# Patient Record
Sex: Female | Born: 1975
Health system: Southern US, Community
[De-identification: ages and names within clinical notes are randomized; demographics above are authoritative.]

## PROBLEM LIST (undated history)

## (undated) DIAGNOSIS — F32A Depression, unspecified: Secondary | ICD-10-CM

## (undated) DIAGNOSIS — K219 Gastro-esophageal reflux disease without esophagitis: Secondary | ICD-10-CM

## (undated) DIAGNOSIS — T7840XA Allergy, unspecified, initial encounter: Secondary | ICD-10-CM

## (undated) DIAGNOSIS — K589 Irritable bowel syndrome without diarrhea: Secondary | ICD-10-CM

## (undated) HISTORY — DX: Depression, unspecified: F32.A

## (undated) HISTORY — PX: AUGMENTATION MAMMAPLASTY: SUR837

## (undated) HISTORY — PX: OTHER SURGICAL HISTORY: SHX169

## (undated) HISTORY — DX: Gastro-esophageal reflux disease without esophagitis: K21.9

## (undated) HISTORY — PX: WISDOM TOOTH EXTRACTION: SHX21

## (undated) HISTORY — DX: Allergy, unspecified, initial encounter: T78.40XA

## (undated) HISTORY — DX: Irritable bowel syndrome, unspecified: K58.9

---

## 2017-08-20 ENCOUNTER — Other Ambulatory Visit: Payer: Self-pay | Admitting: Student

## 2017-08-20 DIAGNOSIS — B373 Candidiasis of vulva and vagina: Secondary | ICD-10-CM

## 2017-08-20 DIAGNOSIS — B3731 Acute candidiasis of vulva and vagina: Secondary | ICD-10-CM

## 2017-08-20 MED ORDER — FLUCONAZOLE 150 MG PO TABS: 150.0000 mg | ORAL_TABLET | Freq: Every day | ORAL | 1 refills | Status: DC

## 2017-08-20 NOTE — Progress Notes (Signed)
Pt called in requesting diflucan for yeast infection. Verified by name and DOB. NKDA. Diflucan sent to pharmacy.  Jorje Guild, NP

## 2017-09-16 ENCOUNTER — Other Ambulatory Visit: Payer: Self-pay | Admitting: Advanced Practice Midwife

## 2017-09-16 DIAGNOSIS — Z1231 Encounter for screening mammogram for malignant neoplasm of breast: Secondary | ICD-10-CM

## 2017-09-19 ENCOUNTER — Other Ambulatory Visit: Payer: Self-pay | Admitting: Advanced Practice Midwife

## 2017-09-19 ENCOUNTER — Ambulatory Visit: Payer: Self-pay | Admitting: Advanced Practice Midwife

## 2017-09-19 DIAGNOSIS — Z1231 Encounter for screening mammogram for malignant neoplasm of breast: Secondary | ICD-10-CM

## 2017-09-21 DIAGNOSIS — Z0000 Encounter for general adult medical examination without abnormal findings: Secondary | ICD-10-CM | POA: Diagnosis not present

## 2017-09-21 DIAGNOSIS — J309 Allergic rhinitis, unspecified: Secondary | ICD-10-CM | POA: Diagnosis not present

## 2017-09-21 DIAGNOSIS — Z23 Encounter for immunization: Secondary | ICD-10-CM | POA: Diagnosis not present

## 2017-09-23 ENCOUNTER — Ambulatory Visit (INDEPENDENT_AMBULATORY_CARE_PROVIDER_SITE_OTHER): Payer: 59

## 2017-09-23 DIAGNOSIS — Z1231 Encounter for screening mammogram for malignant neoplasm of breast: Secondary | ICD-10-CM | POA: Diagnosis not present

## 2017-10-11 ENCOUNTER — Ambulatory Visit: Payer: Self-pay

## 2017-10-28 ENCOUNTER — Ambulatory Visit: Payer: Self-pay | Admitting: Advanced Practice Midwife

## 2017-11-14 ENCOUNTER — Ambulatory Visit (INDEPENDENT_AMBULATORY_CARE_PROVIDER_SITE_OTHER): Payer: 59 | Admitting: Advanced Practice Midwife

## 2017-11-14 ENCOUNTER — Encounter: Payer: Self-pay | Admitting: Advanced Practice Midwife

## 2017-11-14 VITALS — BP 115/73 | HR 91 | Resp 16 | Ht 67.0 in | Wt 139.0 lb

## 2017-11-14 DIAGNOSIS — Z01419 Encounter for gynecological examination (general) (routine) without abnormal findings: Secondary | ICD-10-CM

## 2017-11-14 DIAGNOSIS — Z0000 Encounter for general adult medical examination without abnormal findings: Secondary | ICD-10-CM

## 2017-11-14 DIAGNOSIS — Z7184 Encounter for health counseling related to travel: Secondary | ICD-10-CM

## 2017-11-14 MED ORDER — SCOPOLAMINE 1 MG/3DAYS TD PT72: 1.0000 | MEDICATED_PATCH | TRANSDERMAL | 1 refills | Status: DC

## 2017-11-14 NOTE — Patient Instructions (Signed)

## 2017-11-14 NOTE — Progress Notes (Signed)
Subjective:     Pamela Williamson is a 42 y.o. female here for a routine well-woman exam.  Current complaints: none.  Pt has primary care provider and participates in routine preventive health care.   Gynecologic History Patient's last menstrual period was 11/08/2017. Contraception: none and husband with infertility Last Pap: 2018. Results were: normal Last mammogram: 2019. Results were: normal  Obstetric History OB History  Gravida Para Term Preterm AB Living  2 2 2         SAB TAB Ectopic Multiple Live Births               # Outcome Date GA Lbr Len/2nd Weight Sex Delivery Anes PTL Lv  2 Term 03/21/03    Jerilynn Mages Vag-Spont     1 Term 04/28/01     Vag-Spont        The following portions of the patient's history were reviewed and updated as appropriate: allergies, current medications, past family history, past medical history, past social history, past surgical history and problem list.  Review of Systems A comprehensive review of systems was negative.    Objective:   BP 115/73   Pulse 91   Resp 16   Ht 5\' 7"  (1.702 m)   Wt 139 lb (63 kg)   LMP 11/08/2017   BMI 21.77 kg/m    VS reviewed, nursing note reviewed,  Constitutional: well developed, well nourished, no distress HEENT: normocephalic CV: normal rate Pulm/chest wall: normal effort Breast Exam:  right breast normal without mass, skin or nipple changes or axillary nodes, left breast normal without mass, skin or nipple changes or axillary nodes Abdomen: soft Neuro: alert and oriented x 3 Skin: warm, dry Psych: affect normal Pelvic exam: Deferred  Assessment/Plan:   1. Well woman exam (no gynecological exam) --Pap in 2023 per ASCCP guidelines, well woman exam annually. --Considering IUD for period management. Will rescheduled when desired.  2. Counseling for travel --Pt going on cruise later this month wants to plan for possible motion sickness at sea. - scopolamine (TRANSDERM-SCOP, 1.5 MG,) 1 MG/3DAYS; Place 1  patch (1.5 mg total) onto the skin every 3 (three) days.  Dispense: 10 patch; Refill: 1

## 2018-03-08 ENCOUNTER — Other Ambulatory Visit: Payer: Self-pay | Admitting: Student

## 2018-03-08 DIAGNOSIS — J069 Acute upper respiratory infection, unspecified: Secondary | ICD-10-CM

## 2018-03-08 MED ORDER — AMOXICILLIN-POT CLAVULANATE 875-125 MG PO TABS: 1.0000 | ORAL_TABLET | Freq: Two times a day (BID) | ORAL | 0 refills | Status: AC

## 2018-03-08 MED ORDER — HYDROCOD POLST-CPM POLST ER 10-8 MG/5ML PO SUER: 5.0000 mL | Freq: Two times a day (BID) | ORAL | 0 refills | Status: AC | PRN

## 2018-03-08 NOTE — Progress Notes (Signed)
Patient called in requesting meds for URI & cough that have been ongoing for 2 weeks. Symptoms not improved with OTC meds.  Allergies & pharmacy reviewed.  Discussed reasons to seek immediate tx.   Jorje Guild, NP

## 2018-09-28 ENCOUNTER — Other Ambulatory Visit: Payer: Self-pay | Admitting: Advanced Practice Midwife

## 2018-09-28 DIAGNOSIS — Z1231 Encounter for screening mammogram for malignant neoplasm of breast: Secondary | ICD-10-CM

## 2018-10-05 ENCOUNTER — Ambulatory Visit (INDEPENDENT_AMBULATORY_CARE_PROVIDER_SITE_OTHER): Payer: 59

## 2018-10-05 ENCOUNTER — Other Ambulatory Visit: Payer: Self-pay

## 2018-10-05 DIAGNOSIS — Z1231 Encounter for screening mammogram for malignant neoplasm of breast: Secondary | ICD-10-CM | POA: Diagnosis not present

## 2018-11-07 ENCOUNTER — Other Ambulatory Visit: Payer: Self-pay

## 2018-11-07 ENCOUNTER — Encounter: Payer: Self-pay | Admitting: Advanced Practice Midwife

## 2018-11-07 ENCOUNTER — Ambulatory Visit (INDEPENDENT_AMBULATORY_CARE_PROVIDER_SITE_OTHER): Payer: 59 | Admitting: Advanced Practice Midwife

## 2018-11-07 VITALS — BP 128/78 | HR 75 | Resp 16 | Ht 67.0 in | Wt 140.0 lb

## 2018-11-07 DIAGNOSIS — Z30430 Encounter for insertion of intrauterine contraceptive device: Secondary | ICD-10-CM

## 2018-11-07 DIAGNOSIS — Z975 Presence of (intrauterine) contraceptive device: Secondary | ICD-10-CM | POA: Insufficient documentation

## 2018-11-07 DIAGNOSIS — N939 Abnormal uterine and vaginal bleeding, unspecified: Secondary | ICD-10-CM

## 2018-11-07 MED ORDER — LEVONORGESTREL 19.5 MCG/DAY IU IUD
INTRAUTERINE_SYSTEM | Freq: Once | INTRAUTERINE | Status: AC
  Administered 2018-11-07: 15:00:00 via INTRAUTERINE

## 2018-11-07 NOTE — Progress Notes (Signed)
  GYNECOLOGY PROGRESS NOTE  History:  43 y.o. G2P2000 presents to [facility] office today for problem gyn visit. She reports regular heavy menses, becoming heavier in the last year.  Has tried low dose OCPs with no improvement.  Has cramping pain treated with ibuprofen during menses, with no pain in between menses. Cycles are every 28 days, soaking through super tampons every 2 hours and bleeding on to her clothes at times. She has spotting x 1 week before menses begin so long cycle of bleeding each month is bothersome for her.  She denies h/a, dizziness, shortness of breath, n/v, or fever/chills.    The following portions of the patient's history were reviewed and updated as appropriate: allergies, current medications, past family history, past medical history, past social history, past surgical history and problem list. Last pap smear 2018 was normal, Negative HRHPV.  Review of Systems:  Pertinent items are noted in HPI.   Objective:  Physical Exam Blood pressure 128/78, pulse 75, resp. rate 16, height 5\' 7"  (1.702 m), weight 140 lb (63.5 kg), last menstrual period 11/03/2018. VS reviewed, nursing note reviewed,  Constitutional: well developed, well nourished, no distress HEENT: normocephalic CV: normal rate Pulm/chest wall: normal effort Breast Exam: deferred Abdomen: soft Neuro: alert and oriented x 3 Skin: warm, dry Psych: affect normal Pelvic exam: Cervix pink, visually closed, without lesion, small amount dark red bleeding, vaginal walls and external genitalia normal   IUD Procedure Note Patient identified, informed consent performed.  Discussed risks of irregular bleeding, cramping, infection, malpositioning or misplacement of the IUD outside the uterus which may require further procedures. Time out was performed.  Urine pregnancy test negative.  Speculum placed in the vagina.  Cervix visualized.  Cleaned with Betadine x 2.  Grasped anteriorly with a single tooth tenaculum.  Uterus  sounded to 6.5 cm.  Liletta IUD placed per manufacturer's recommendations.  Strings trimmed to 4 cm. Tenaculum was removed, good hemostasis noted.  Patient tolerated procedure well.   Patient was given post-procedure instructions and the Liletta care card with expiration date.  Patient was also asked to check IUD strings periodically and follow up in 4-6 weeks for IUD check.   Assessment & Plan:   1. Encounter for IUD insertion --Discussed benefits/risks of IUD --Liletta IUD placed without difficulty. See procedure note. --String check in 4-6 weeks  2. Abnormal uterine bleeding (AUB) --Pt with regular heavy menses, becoming heavier in last year.  Has tried low dose OCPs with no improvement.  Has cramping pain treated with ibuprofen during menses, no pain in between menses. Cycles are every 28 days, soaking through super tampons every 2 hours and bleeding on to her clothes at times. --She had IUD before she had children, desires to try IUD for bleeding management.  Fatima Blank, CNM 4:12 PM

## 2018-11-30 DIAGNOSIS — J309 Allergic rhinitis, unspecified: Secondary | ICD-10-CM | POA: Diagnosis not present

## 2018-11-30 DIAGNOSIS — Z0000 Encounter for general adult medical examination without abnormal findings: Secondary | ICD-10-CM | POA: Diagnosis not present

## 2018-11-30 DIAGNOSIS — E785 Hyperlipidemia, unspecified: Secondary | ICD-10-CM | POA: Diagnosis not present

## 2018-12-06 DIAGNOSIS — Z0000 Encounter for general adult medical examination without abnormal findings: Secondary | ICD-10-CM | POA: Diagnosis not present

## 2018-12-06 DIAGNOSIS — E785 Hyperlipidemia, unspecified: Secondary | ICD-10-CM | POA: Diagnosis not present

## 2018-12-26 ENCOUNTER — Ambulatory Visit (INDEPENDENT_AMBULATORY_CARE_PROVIDER_SITE_OTHER): Payer: 59 | Admitting: Advanced Practice Midwife

## 2018-12-26 ENCOUNTER — Other Ambulatory Visit: Payer: Self-pay

## 2018-12-26 ENCOUNTER — Encounter: Payer: Self-pay | Admitting: Advanced Practice Midwife

## 2018-12-26 VITALS — BP 111/74 | HR 93 | Resp 16 | Ht 67.0 in | Wt 143.0 lb

## 2018-12-26 DIAGNOSIS — Z1151 Encounter for screening for human papillomavirus (HPV): Secondary | ICD-10-CM | POA: Diagnosis not present

## 2018-12-26 DIAGNOSIS — N939 Abnormal uterine and vaginal bleeding, unspecified: Secondary | ICD-10-CM

## 2018-12-26 DIAGNOSIS — Z30431 Encounter for routine checking of intrauterine contraceptive device: Secondary | ICD-10-CM

## 2018-12-26 DIAGNOSIS — Z01419 Encounter for gynecological examination (general) (routine) without abnormal findings: Secondary | ICD-10-CM | POA: Diagnosis not present

## 2018-12-26 DIAGNOSIS — Z124 Encounter for screening for malignant neoplasm of cervix: Secondary | ICD-10-CM

## 2018-12-26 NOTE — Progress Notes (Signed)
  GYNECOLOGY CLINIC PROGRESS NOTE  History:  43 y.o. G2P2000 here today for today for IUD string check; Liletta IUD was placed  11/07/18. No complaints about the IUD, no concerning side effects. Pt continues to have AUB with frequent spotting, plus light monthly menses, but wants to continue IUD at this time.  No episodes of heavy bleeding since IUD was placed.  Pt has primary care provider, last visit in 2020, with routine health maintenance up to date including lipids.  The following portions of the patient's history were reviewed and updated as appropriate: allergies, current medications, past family history, past medical history, past social history, past surgical history and problem list. Last pap smear on 2018 was normal, neg HRHPV.  Review of Systems:  Pertinent items are noted in HPI.   Objective:  Physical Exam Blood pressure 111/74, pulse 93, resp. rate 16, height 5\' 7"  (1.702 m), weight 143 lb (64.9 kg). Gen: NAD Abd: Soft, nontender and nondistended Pelvic: Normal appearing external genitalia; normal appearing vaginal mucosa and cervix.  IUD strings visualized, about 3 cm in length outside cervix.   Assessment & Plan:  1. Well woman exam with routine gynecological exam --Doing well, no gyn concerns other than AUB addressed at last visit. IUD in place, string check today. --Mammogram this year wnl - Cytology - PAP( [place])  2. IUD check up --String visible, wnl  3. Abnormal uterine bleeding (AUB) --Pt with 18 days of light bleeding this month, light period followed by mostly spotting for 2 weeks, now resolved.  No episodes of heavy bleeding.  Will continue with IUD and schedule follow up as needed.  Patient to keep IUD in place for five years; can come in for removal if she desires pregnancy within the next five years. Routine preventative health maintenance measures emphasized.  Fatima Blank, CNM 9:45 AM

## 2018-12-27 LAB — CYTOLOGY - PAP
Diagnosis: NEGATIVE
HPV: NOT DETECTED

## 2019-10-09 ENCOUNTER — Other Ambulatory Visit: Payer: Self-pay | Admitting: Advanced Practice Midwife

## 2019-10-09 DIAGNOSIS — Z1231 Encounter for screening mammogram for malignant neoplasm of breast: Secondary | ICD-10-CM

## 2019-10-11 ENCOUNTER — Ambulatory Visit (INDEPENDENT_AMBULATORY_CARE_PROVIDER_SITE_OTHER): Payer: 59

## 2019-10-11 ENCOUNTER — Other Ambulatory Visit: Payer: Self-pay

## 2019-10-11 DIAGNOSIS — Z1231 Encounter for screening mammogram for malignant neoplasm of breast: Secondary | ICD-10-CM | POA: Diagnosis not present

## 2020-02-25 DIAGNOSIS — Z20828 Contact with and (suspected) exposure to other viral communicable diseases: Secondary | ICD-10-CM | POA: Diagnosis not present

## 2020-06-05 DIAGNOSIS — Z20828 Contact with and (suspected) exposure to other viral communicable diseases: Secondary | ICD-10-CM | POA: Diagnosis not present

## 2020-08-25 ENCOUNTER — Other Ambulatory Visit: Payer: Self-pay | Admitting: Student

## 2020-08-25 MED ORDER — CYCLOBENZAPRINE HCL 5 MG PO TABS: 5.0000 mg | ORAL_TABLET | Freq: Three times a day (TID) | ORAL | 0 refills | Status: AC | PRN

## 2020-10-07 ENCOUNTER — Other Ambulatory Visit (HOSPITAL_BASED_OUTPATIENT_CLINIC_OR_DEPARTMENT_OTHER): Payer: Self-pay | Admitting: Advanced Practice Midwife

## 2020-10-07 DIAGNOSIS — Z1231 Encounter for screening mammogram for malignant neoplasm of breast: Secondary | ICD-10-CM

## 2020-10-15 ENCOUNTER — Ambulatory Visit (INDEPENDENT_AMBULATORY_CARE_PROVIDER_SITE_OTHER): Payer: 59

## 2020-10-15 ENCOUNTER — Other Ambulatory Visit: Payer: Self-pay

## 2020-10-15 DIAGNOSIS — Z1231 Encounter for screening mammogram for malignant neoplasm of breast: Secondary | ICD-10-CM | POA: Diagnosis not present

## 2020-11-04 DIAGNOSIS — Z0000 Encounter for general adult medical examination without abnormal findings: Secondary | ICD-10-CM | POA: Diagnosis not present

## 2020-11-04 DIAGNOSIS — J309 Allergic rhinitis, unspecified: Secondary | ICD-10-CM | POA: Diagnosis not present

## 2020-11-11 DIAGNOSIS — Z0000 Encounter for general adult medical examination without abnormal findings: Secondary | ICD-10-CM | POA: Diagnosis not present

## 2021-01-05 DIAGNOSIS — L579 Skin changes due to chronic exposure to nonionizing radiation, unspecified: Secondary | ICD-10-CM | POA: Diagnosis not present

## 2021-01-05 DIAGNOSIS — L821 Other seborrheic keratosis: Secondary | ICD-10-CM | POA: Diagnosis not present

## 2021-01-05 DIAGNOSIS — D1801 Hemangioma of skin and subcutaneous tissue: Secondary | ICD-10-CM | POA: Diagnosis not present

## 2021-01-05 DIAGNOSIS — L814 Other melanin hyperpigmentation: Secondary | ICD-10-CM | POA: Diagnosis not present

## 2021-01-05 DIAGNOSIS — D225 Melanocytic nevi of trunk: Secondary | ICD-10-CM | POA: Diagnosis not present

## 2021-01-21 ENCOUNTER — Encounter: Payer: Self-pay | Admitting: Internal Medicine

## 2021-02-18 ENCOUNTER — Ambulatory Visit (AMBULATORY_SURGERY_CENTER): Payer: Self-pay

## 2021-02-18 ENCOUNTER — Other Ambulatory Visit: Payer: Self-pay

## 2021-02-18 ENCOUNTER — Encounter: Payer: Self-pay | Admitting: Internal Medicine

## 2021-02-18 VITALS — Ht 67.0 in | Wt 140.0 lb

## 2021-02-18 DIAGNOSIS — Z1211 Encounter for screening for malignant neoplasm of colon: Secondary | ICD-10-CM

## 2021-02-18 MED ORDER — NA SULFATE-K SULFATE-MG SULF 17.5-3.13-1.6 GM/177ML PO SOLN: 1.0000 | ORAL | 0 refills | Status: DC

## 2021-02-18 NOTE — Progress Notes (Signed)

## 2021-03-03 ENCOUNTER — Ambulatory Visit (AMBULATORY_SURGERY_CENTER): Payer: 59 | Admitting: Internal Medicine

## 2021-03-03 ENCOUNTER — Other Ambulatory Visit: Payer: Self-pay

## 2021-03-03 ENCOUNTER — Encounter: Payer: Self-pay | Admitting: Internal Medicine

## 2021-03-03 VITALS — BP 110/76 | HR 72 | Temp 97.5°F | Resp 20 | Ht 67.0 in | Wt 140.0 lb

## 2021-03-03 DIAGNOSIS — Z1211 Encounter for screening for malignant neoplasm of colon: Secondary | ICD-10-CM

## 2021-03-03 DIAGNOSIS — K649 Unspecified hemorrhoids: Secondary | ICD-10-CM

## 2021-03-03 DIAGNOSIS — D124 Benign neoplasm of descending colon: Secondary | ICD-10-CM

## 2021-03-03 DIAGNOSIS — D123 Benign neoplasm of transverse colon: Secondary | ICD-10-CM | POA: Diagnosis not present

## 2021-03-03 DIAGNOSIS — K219 Gastro-esophageal reflux disease without esophagitis: Secondary | ICD-10-CM | POA: Diagnosis not present

## 2021-03-03 DIAGNOSIS — K635 Polyp of colon: Secondary | ICD-10-CM | POA: Diagnosis not present

## 2021-03-03 HISTORY — PX: COLONOSCOPY: SHX174

## 2021-03-03 MED ORDER — HYDROCORTISONE ACE-PRAMOXINE 1-1 % EX CREA: 1.0000 "application " | TOPICAL_CREAM | Freq: Two times a day (BID) | CUTANEOUS | 0 refills | Status: AC

## 2021-03-03 MED ORDER — SODIUM CHLORIDE 0.9 % IV SOLN: 500.0000 mL | Freq: Once | INTRAVENOUS | Status: DC

## 2021-03-03 NOTE — Patient Instructions (Signed)
Discharge instructions given. Handouts on polyps and hemorrhoids. Resume previous medications. YOU HAD AN ENDOSCOPIC PROCEDURE TODAY AT THE [facility]:   Refer to the procedure report that was given to you for any specific questions about what was found during the examination.  If the procedure report does not answer your questions, please call your gastroenterologist to clarify.  If you requested that your care partner not be given the details of your procedure findings, then the procedure report has been included in a sealed envelope for you to review at your convenience later.  YOU SHOULD EXPECT: Some feelings of bloating in the abdomen. Passage of more gas than usual.  Walking can help get rid of the air that was put into your GI tract during the procedure and reduce the bloating. If you had a lower endoscopy (such as a colonoscopy or flexible sigmoidoscopy) you may notice spotting of blood in your stool or on the toilet paper. If you underwent a bowel prep for your procedure, you may not have a normal bowel movement for a few days.  Please Note:  You might notice some irritation and congestion in your nose or some drainage.  This is from the oxygen used during your procedure.  There is no need for concern and it should clear up in a day or so.  SYMPTOMS TO REPORT IMMEDIATELY:  Following lower endoscopy (colonoscopy or flexible sigmoidoscopy):  Excessive amounts of blood in the stool  Significant tenderness or worsening of abdominal pains  Swelling of the abdomen that is new, acute  Fever of 100F or higher   For urgent or emergent issues, a gastroenterologist can be reached at any hour by calling [phone]. Do not use MyChart messaging for urgent concerns.    DIET:  We do recommend a small meal at first, but then you may proceed to your regular diet.  Drink plenty of fluids but you should avoid alcoholic beverages for 24 hours.  ACTIVITY:  You should plan to take it  easy for the rest of today and you should NOT DRIVE or use heavy machinery until tomorrow (because of the sedation medicines used during the test).    FOLLOW UP: Our staff will call the number listed on your records 48-72 hours following your procedure to check on you and address any questions or concerns that you may have regarding the information given to you following your procedure. If we do not reach you, we will leave a message.  We will attempt to reach you two times.  During this call, we will ask if you have developed any symptoms of COVID 19. If you develop any symptoms (ie: fever, flu-like symptoms, shortness of breath, cough etc.) before then, please call [phone].  If you test positive for Covid 19 in the 2 weeks post procedure, please call and report this information to us.    If any biopsies were taken you will be contacted by phone or by letter within the next 1-3 weeks.  Please call us at [phone] if you have not heard about the biopsies in 3 weeks.    SIGNATURES/CONFIDENTIALITY: You and/or your care partner have signed paperwork which will be entered into your electronic medical record.  These signatures attest to the fact that that the information above on your After Visit Summary has been reviewed and is understood.  Full responsibility of the confidentiality of this discharge information lies with you and/or your care-partner.  

## 2021-03-03 NOTE — Progress Notes (Signed)
Report given to PACU, vss 

## 2021-03-03 NOTE — Progress Notes (Signed)
GASTROENTEROLOGY PROCEDURE H&P NOTE   Primary Care Physician: Cyril Loosen, NP    Reason for Procedure:   Colon cancer screening  Plan:    Colonoscopy  Patient is appropriate for endoscopic procedure(s) in the ambulatory ([place]) setting.  The nature of the procedure, as well as the risks, benefits, and alternatives were carefully and thoroughly reviewed with the patient. Ample time for discussion and questions allowed. The patient understood, was satisfied, and agreed to proceed.     HPI: Pamela Williamson is a 45 y.o. female who presents for colonoscopy for colon cancer screening. Sees scant amounts of rectal bleeding when she wipes with toilet paper. Denies melena, unintentional weight loss, and changes in bowel habits. Denies blood thinner use. Denies fam hx of colon cancer. She denies prior abdominal surgeries.  Past Medical History:  Diagnosis Date   Allergy    Depression    GERD (gastroesophageal reflux disease)    IBS (irritable bowel syndrome)     Past Surgical History:  Procedure Laterality Date   AUGMENTATION MAMMAPLASTY Bilateral    Breast augmentation with breast lift   index finger  Right    WISDOM TOOTH EXTRACTION     x4 removed    Prior to Admission medications   Medication Sig Start Date End Date Taking? Authorizing Provider  betamethasone, augmented, (DIPROLENE) 0.05 % gel  11/20/18  Yes [provider]  Levonorgestrel (LILETTA, 52 MG, IU) by Intrauterine route.   Yes [provider]  cetirizine (ZYRTEC) 10 MG tablet Take 10 mg by mouth daily as needed for allergies.    [provider]  cyclobenzaprine (FLEXERIL) 5 MG tablet Take 1 tablet (5 mg total) by mouth 3 (three) times daily as needed for muscle spasms (& back pain). 08/25/20   Jorje Guild, NP  diphenhydrAMINE (BENADRYL) 12.5 MG chewable tablet Chew 12.5 mg by mouth 4 (four) times daily as needed for allergies.    [provider]  ibuprofen (ADVIL) 200  MG tablet Take by mouth.    [provider]    Current Outpatient Medications  Medication Sig Dispense Refill   betamethasone, augmented, (DIPROLENE) 0.05 % gel      Levonorgestrel (LILETTA, 52 MG, IU) by Intrauterine route.     cetirizine (ZYRTEC) 10 MG tablet Take 10 mg by mouth daily as needed for allergies.     cyclobenzaprine (FLEXERIL) 5 MG tablet Take 1 tablet (5 mg total) by mouth 3 (three) times daily as needed for muscle spasms (& back pain). 20 tablet 0   diphenhydrAMINE (BENADRYL) 12.5 MG chewable tablet Chew 12.5 mg by mouth 4 (four) times daily as needed for allergies.     ibuprofen (ADVIL) 200 MG tablet Take by mouth.     Current Facility-Administered Medications  Medication Dose Route Frequency Provider Last Rate Last Admin   0.9 %  sodium chloride infusion  500 mL Intravenous Once Sharyn Creamer, MD        Allergies as of 03/03/2021   (No Known Allergies)    Family History  Problem Relation Age of Onset   Hypertension Mother    Hyperlipidemia Mother    Diabetes Mother    Obesity Mother    Colon polyps Father    Parkinson's disease Father    Hypothyroidism Sister    Arthritis/Rheumatoid Sister    Hypertension Sister    Breast cancer Maternal Grandmother    Colon cancer Neg Hx    Esophageal cancer Neg Hx    Rectal  cancer Neg Hx    Stomach cancer Neg Hx     Social History   Socioeconomic History   Marital status: Married    Spouse name: Not on file   Number of children: Not on file   Years of education: Not on file   Highest education level: Not on file  Occupational History   Occupation: midwife  Tobacco Use   Smoking status: Never   Smokeless tobacco: Never  Vaping Use   Vaping Use: Never used  Substance and Sexual Activity   Alcohol use: Yes    Comment: rarely   Drug use: Never   Sexual activity: Yes    Partners: Male    Birth control/protection: I.U.D.    Comment: Husband has absence of Vas Deferens  Other Topics Concern   Not  on file  Social History Narrative   Not on file   Social Determinants of Health   Financial Resource Strain: Not on file  Food Insecurity: Not on file  Transportation Needs: Not on file  Physical Activity: Not on file  Stress: Not on file  Social Connections: Not on file  Intimate Partner Violence: Not on file    Physical Exam: Vital signs in last 24 hours: BP 130/87   Pulse 90   Temp (!) 97.5 F (36.4 C) (Temporal)   Ht 5\' 7"  (1.702 m)   Wt 140 lb (63.5 kg)   SpO2 100%   BMI 21.93 kg/m  GEN: NAD EYE: Sclerae anicteric ENT: MMM CV: Non-tachycardic Pulm: No increased work of breathing GI: Soft, NT/ND NEURO:  Alert & Oriented   Christia Reading, MD [facility]  03/03/2021 1:33 PM

## 2021-03-03 NOTE — Progress Notes (Signed)
Called to room to assist during endoscopic procedure.  Patient ID and intended procedure confirmed with present staff. Received instructions for my participation in the procedure from the performing physician.  

## 2021-03-03 NOTE — Progress Notes (Signed)
Pt's states no medical or surgical changes since previsit or office visit. 

## 2021-03-03 NOTE — Op Note (Signed)
[place] Patient Name: Pamela Williamson Procedure Date: 03/03/2021 1:40 PM MRN: [id number] Endoscopist: Sonny Masters "Pamela Williamson ,  Age: 45 Referring MD:  Date of Birth: 1976/01/23 Gender: Female Account #: [id number] Procedure:                Colonoscopy Indications:              Screening for colorectal malignant neoplasm Medicines:                Monitored Anesthesia Care Procedure:                Pre-Anesthesia Assessment:                           - Prior to the procedure, a History and Physical                            was performed, and patient medications and                            allergies were reviewed. The patient's tolerance of                            previous anesthesia was also reviewed. The risks                            and benefits of the procedure and the sedation                            options and risks were discussed with the patient.                            All questions were answered, and informed consent                            was obtained. Prior Anticoagulants: The patient has                            taken no previous anticoagulant or antiplatelet                            agents. ASA Grade Assessment: II - A patient with                            mild systemic disease. After reviewing the risks                            and benefits, the patient was deemed in                            satisfactory condition to undergo the procedure.                           After obtaining informed consent, the colonoscope  was passed under direct vision. Throughout the                            procedure, the patient's blood pressure, pulse, and                            oxygen saturations were monitored continuously. The                            PCF-HQ190L Colonoscope was introduced through the                            anus and advanced to the the terminal ileum. The                             colonoscopy was performed without difficulty. The                            patient tolerated the procedure well. The quality                            of the bowel preparation was good. Scope In: 1:45:31 PM Scope Out: 2:18:28 PM Scope Withdrawal Time: 0 hours 26 minutes 45 seconds  Total Procedure Duration: 0 hours 32 minutes 57 seconds  Findings:                 The terminal ileum appeared normal.                           Four sessile polyps were found in the descending                            colon and transverse colon. The polyps were 2 to 10                            mm in size. These polyps were removed with a cold                            snare. Resection and retrieval were complete.                           Non-bleeding external and internal hemorrhoids were                            found during retroflexion and during perianal exam. Complications:            No immediate complications. Estimated Blood Loss:     Estimated blood loss was minimal. Impression:               - The examined portion of the ileum was normal.                           - Four 2 to 10 mm polyps in the descending colon  and in the transverse colon, removed with a cold                            snare. Resected and retrieved.                           - Non-bleeding external and internal hemorrhoids. Recommendation:           - Discharge patient to home (with escort).                           - Await pathology results.                           - The findings and recommendations were discussed                            with the patient. [id number]Pamela Williamson,  03/03/2021 2:24:37 PM

## 2021-03-05 ENCOUNTER — Telehealth: Payer: Self-pay | Admitting: *Deleted

## 2021-03-05 ENCOUNTER — Telehealth: Payer: Self-pay

## 2021-03-05 NOTE — Telephone Encounter (Signed)
  Follow up Call-  Call back number 03/03/2021  Post procedure Call Back phone  # [phone]  Permission to leave phone message Yes  Some recent data might be hidden     Patient questions:  Do you have a fever, pain , or abdominal swelling? No. Pain Score  0 *  Have you tolerated food without any problems? Yes.    Have you been able to return to your normal activities? Yes.    Do you have any questions about your discharge instructions: Diet   No. Medications  No. Follow up visit  No.  Do you have questions or concerns about your Care? No.  Actions: * If pain score is 4 or above: No action needed, pain <4.   Have you developed a fever since your procedure? no  2.   Have you had an respiratory symptoms (SOB or cough) since your procedure? no  3.   Have you tested positive for COVID 19 since your procedure no  4.   Have you had any family members/close contacts diagnosed with the COVID 19 since your procedure?  no   If yes to any of these questions please route to Joylene John, RN and Joella Prince, RN

## 2021-03-05 NOTE — Telephone Encounter (Signed)
Attempted f/u phone call. No answer. Left message. °

## 2021-03-08 ENCOUNTER — Encounter: Payer: Self-pay | Admitting: Internal Medicine

## 2021-03-27 ENCOUNTER — Ambulatory Visit: Payer: 59 | Admitting: Internal Medicine

## 2021-04-08 NOTE — Progress Notes (Signed)
Chief Complaint: IBS  HPI : 45 year old female with history of GERD and IBS presents for IBS  Over the last few years, she has noticed that she has had bloating, almost like she has trapped gas. This causes some abdominal pain. The pain is located diffusely, both in the upper and lower abdomen. It has been difficult to specifically pinpoint a food that has been making her symptoms worse. A lot of times when she has a BM, she has mushy stools. On average she has one stool every other day. After her colonoscopy, her stools were more normal in appearance.  After colonoscopy, she also felt like her abdominal pain and bloating was improved.  If she has a big BM, then her symptoms improve for all. She does not think that she overtly constipated. If she eats pasta that has a buttery cream sauce, sometimes this will cause her symptoms.  However, the next time she has the same food, she may not experience the same symptoms.  Denies N&V, hematochezia, melena, weight loss, rectal pain.  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  She has had 2 children in the past via vaginal delivery.  Past Medical History:  Diagnosis Date   Allergy    Depression    GERD (gastroesophageal reflux disease)    IBS (irritable bowel syndrome)      Past Surgical History:  Procedure Laterality Date   AUGMENTATION MAMMAPLASTY Bilateral    Breast augmentation with breast lift   COLONOSCOPY  03/03/2021   Callee Rohrig at [facility]   index finger  Right    WISDOM TOOTH EXTRACTION     x4 removed   Family History  Problem Relation Age of Onset   Hypertension Mother    Hyperlipidemia Mother    Diabetes Mother    Obesity Mother    Colon polyps Father    Parkinson's disease Father    Hypothyroidism Sister    Arthritis/Rheumatoid Sister    Hypertension Sister    Breast cancer Maternal Grandmother    Colon cancer Neg Hx    Esophageal cancer Neg Hx    Rectal cancer Neg Hx    Stomach cancer Neg Hx    Social  History   Tobacco Use   Smoking status: Never   Smokeless tobacco: Never  Vaping Use   Vaping Use: Never used  Substance Use Topics   Alcohol use: Yes    Comment: rarely   Drug use: Never   Current Outpatient Medications  Medication Sig Dispense Refill   betamethasone, augmented, (DIPROLENE) 0.05 % gel      cetirizine (ZYRTEC) 10 MG tablet Take 10 mg by mouth daily as needed for allergies.     cyclobenzaprine (FLEXERIL) 5 MG tablet Take 1 tablet (5 mg total) by mouth 3 (three) times daily as needed for muscle spasms (& back pain). 20 tablet 0   diphenhydrAMINE (BENADRYL) 12.5 MG chewable tablet Chew 12.5 mg by mouth 4 (four) times daily as needed for allergies.     ibuprofen (ADVIL) 200 MG tablet Take by mouth.     Levonorgestrel (LILETTA, 52 MG, IU) by Intrauterine route.     pramoxine-hydrocortisone (PROCTOCREAM-HC) 1-1 % rectal cream Place 1 application rectally 2 (two) times daily. Apply twice a day for 7 days. 30 g 0   No current facility-administered medications for this visit.   No Known Allergies   Review of Systems: All systems reviewed and negative except where noted in HPI.  Physical Exam: There were no vitals taken for this visit. Constitutional: Pleasant,well-developed, female in no acute distress. HEENT: Normocephalic and atraumatic. Conjunctivae are normal. No scleral icterus. Cardiovascular: Normal rate, regular rhythm.  Pulmonary/chest: Effort normal and breath sounds normal. No wheezing, rales or rhonchi. Abdominal: Soft, nondistended, nontender. Bowel sounds active throughout. There are no masses palpable. No hepatomegaly. Extremities: No edema Neurological: Alert and oriented to person place and time. Skin: Skin is warm and dry. No rashes noted. Psychiatric: Normal mood and affect. Behavior is normal.  Labs 11/2020: CMP and CBC unremarkable.  Colonoscopy 03/03/21: - The examined portion of the ileum was normal. - Four 2 to 10 mm polyps in the  descending colon and in the transverse colon, removed with a cold snare. Resected and retrieved. - Non-bleeding external and internal hemorrhoids. Path: Surgical [P], colon, transverse and descending, polyp (3) - SESSILE SERRATED POLYP WITHOUT CYTOLOGIC DYSPLASIA. - HYPERPLASTIC POLYP(S).  ASSESSMENT AND PLAN:  History of sessile serrated adenoma IBS Constipation GERD Hemorrhoids Patient presents to discuss her issues with IBS and her recent finding of colon polyps.  I do agree that her symptoms of abdominal pain and bloating with some mild constipation do appear to be consistent with IBS.  She had improvement in her symptoms after her colonoscopy prep and has improvement in her symptoms after having a BM.  Thus I asked the patient to start off with conservative measures for encouraging more frequent bowel movements.  Will increase hydration, increase physical activity, start daily fiber supplement, and institute a low FODMAP diet.  I discussed with the finding of a sessile serrated adenoma means, and why we recommend follow-up colonoscopy within a certain time interval. - Encourage 8 cups of water per day - Walk 30 minutes every day - Eat daily fiber supplement with Metamucil or Benefiber - Low FODMAP diet - Repeat colonoscopy in 3 years for polyp surveillance - Patient will try the  hydrocortisone cream that I prescribed her previously for hemorrhoids - Consider Miralax or pelvic PT in the future - RTC PRN per patient preference  Christia Reading, MD

## 2021-04-09 ENCOUNTER — Ambulatory Visit (INDEPENDENT_AMBULATORY_CARE_PROVIDER_SITE_OTHER): Payer: 59 | Admitting: Internal Medicine

## 2021-04-09 ENCOUNTER — Encounter: Payer: Self-pay | Admitting: Internal Medicine

## 2021-04-09 VITALS — BP 122/72 | HR 80 | Ht 67.0 in | Wt 145.1 lb

## 2021-04-09 DIAGNOSIS — K581 Irritable bowel syndrome with constipation: Secondary | ICD-10-CM

## 2021-04-09 DIAGNOSIS — K649 Unspecified hemorrhoids: Secondary | ICD-10-CM

## 2021-04-09 DIAGNOSIS — Z86010 Personal history of colonic polyps: Secondary | ICD-10-CM

## 2021-04-09 DIAGNOSIS — K219 Gastro-esophageal reflux disease without esophagitis: Secondary | ICD-10-CM | POA: Diagnosis not present

## 2021-04-09 NOTE — Patient Instructions (Signed)
If you are age 47 or older, your body mass index should be between 23-30. Your Body mass index is 22.73 kg/m. If this is out of the aforementioned range listed, please consider follow up with your Primary Care Provider.  If you are age 40 or younger, your body mass index should be between 19-25. Your Body mass index is 22.73 kg/m. If this is out of the aformentioned range listed, please consider follow up with your Primary Care Provider.   ________________________________________________________  The [facility] providers would like to encourage you to use [facility] to communicate with providers for non-urgent requests or questions.  Due to long hold times on the telephone, sending your provider a message by [facility] may be a faster and more efficient way to get a response.  Please allow 48 business hours for a response.  Please remember that this is for non-urgent requests.  _______________________________________________________  Drink 8 cups per day  30 minutes of walking per day  Low FODMAP diet   Daily fiber supplement

## 2021-04-10 ENCOUNTER — Ambulatory Visit: Payer: 59 | Admitting: Internal Medicine

## 2021-07-16 DIAGNOSIS — M79642 Pain in left hand: Secondary | ICD-10-CM | POA: Diagnosis not present

## 2021-07-16 DIAGNOSIS — S62615A Displaced fracture of proximal phalanx of left ring finger, initial encounter for closed fracture: Secondary | ICD-10-CM | POA: Diagnosis not present

## 2021-07-16 DIAGNOSIS — S62627A Displaced fracture of medial phalanx of left little finger, initial encounter for closed fracture: Secondary | ICD-10-CM | POA: Diagnosis not present

## 2021-07-23 ENCOUNTER — Ambulatory Visit: Payer: 59 | Admitting: Orthopedic Surgery

## 2021-07-23 ENCOUNTER — Encounter: Payer: Self-pay | Admitting: Orthopedic Surgery

## 2021-07-23 ENCOUNTER — Other Ambulatory Visit: Payer: Self-pay

## 2021-07-23 ENCOUNTER — Ambulatory Visit: Payer: Self-pay

## 2021-07-23 VITALS — BP 123/86 | HR 97 | Ht 67.0 in | Wt 140.0 lb

## 2021-07-23 DIAGNOSIS — S62627A Displaced fracture of medial phalanx of left little finger, initial encounter for closed fracture: Secondary | ICD-10-CM

## 2021-07-23 DIAGNOSIS — S62645A Nondisplaced fracture of proximal phalanx of left ring finger, initial encounter for closed fracture: Secondary | ICD-10-CM | POA: Diagnosis not present

## 2021-07-23 NOTE — Progress Notes (Signed)
? ?Office Visit Note ?  ?Patient: Pamela Williamson           ?Date of Birth: 30-Sep-1975           ?MRN: [id number] ?Visit Date: 07/23/2021 ?             ?Requested by: Cyril Loosen, NP ?[address] ?[place],  [place] [address] ?PCP: Cyril Loosen, NP ? ? ?Assessment & Plan: ?Visit Diagnoses:  ?1. Closed displaced fracture of middle phalanx of left little finger, initial encounter   ?2. Closed nondisplaced fracture of proximal phalanx of left ring finger, initial encounter   ? ? ?Plan: We reviewed the x-rays of her left hand showing a nondisplaced oblique fracture through the ring finger proximal phalanx and a displaced, extra-articular fracture of the distal aspect of the small finger middle phalanx.  She has no clinical malrotation of the ring finger with attempted flexion.  We discussed that the small finger middle phalanx fracture would benefit from close reduction and percutaneous pinning to restore bony alignment and maximize potential functional outcome.  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  We discussed the timeline of normal phalangeal fracture healing.  We discussed the potential role of hand therapy and restoring range of motion.  We discussed the risk of surgery including bleeding, pin tract infection, fracture nonunion or malunion, and need for additional surgery.  She is not sure she is interested in pursuing surgical fixation of the fracture at this time.  She was placed in a dorsal extension splint at the small finger for immobilization.  She is going to reach back out to me if she would like to pursue surgical treatment. ? ?Follow-Up Instructions: No follow-ups on file.  ? ?Orders:  ?Orders Placed This Encounter  ?Procedures  ? XR Hand Complete Left  ? ?No orders of the defined types were placed in this encounter. ? ? ? ? Procedures: ?No procedures performed ? ? ?Clinical  Data: ?No additional findings. ? ? ?Subjective: ?Chief Complaint  ?Patient presents with  ? Left Hand - Fracture  ?  Right handed, +n/t, swelling, no pain, states that her dog pulled her and she had the leash in the left hand. Was seen at [place] in [facility]  ? ? ?This is a 46 year old right-hand-dominant female who was walking her dog last week when her neighbors dog came off the leash.  She was using her left hand restrain her own dog and had the leash wrapped around her hand.  She was seen in [place] urgent [facility] [place] where x-rays were obtained which demonstrated a nondisplaced oblique fracture through the ring finger proximal phalanx and displaced fracture of the small finger middle phalanx.  She was placed in a removable ulnar gutter brace.  She presents today for follow-up.  She still has pain mostly at the small finger.  She has limited range of motion of this finger secondary to pain and stiffness.  She denies other injury elsewhere in her hand. ? ? ?Review of Systems ? ? ?Objective: ?Vital Signs: BP 123/86 (BP Location: Right Arm, Patient Position: Sitting)   Pulse 97   Ht '5\' 7"'$  (1.702 m)   Wt 140 lb (63.5 kg)   BMI 21.93 kg/m?  ? ?Physical Exam ?Constitutional:   ?   Appearance: Normal appearance.  ?Cardiovascular:  ?   Rate  and Rhythm: Normal rate.  ?   Pulses: Normal pulses.  ?Pulmonary:  ?   Effort: Pulmonary effort is normal.  ?Skin: ?   General: Skin is warm and dry.  ?   Capillary Refill: Capillary refill takes less than 2 seconds.  ?Neurological:  ?   Mental Status: She is alert.  ? ? ?Left Hand Exam  ? ?Tenderness  ?Left hand tenderness location: TTP at ring and small fingers.  ? ?Other  ?Erythema: absent ?Sensation: normal ?Pulse: present ? ?Comments:  Ecchymosis involving ring and small fingers with extension to ulnar palm.  Limited ROM of ring finger secondary to immobilization.  No evidence of malrotation or ring finger within her limited ROM. No ROM of small  finger DIP joint secondary to pain.  ? ? ? ? ?Specialty Comments:  ?No specialty comments available. ? ?Imaging: ?No results found. ? ? ?PMFS History: ?Patient Active Problem List  ? Diagnosis Date Noted  ? IUD (intrauterine device) in place 11/07/2018  ? Abnormal uterine bleeding (AUB) 11/07/2018  ? ?Past Medical History:  ?Diagnosis Date  ? Allergy   ? Depression   ? GERD (gastroesophageal reflux disease)   ? IBS (irritable bowel syndrome)   ?  ?Family History  ?Problem Relation Age of Onset  ? Hypertension Mother   ? Hyperlipidemia Mother   ? Diabetes Mother   ? Obesity Mother   ? Colon polyps Father   ? Parkinson's disease Father   ? Hypothyroidism Sister   ? Arthritis/Rheumatoid Sister   ? Hypertension Sister   ? Breast cancer Maternal Grandmother   ? Colon cancer Neg Hx   ? Esophageal cancer Neg Hx   ? Rectal cancer Neg Hx   ? Stomach cancer Neg Hx   ?  ?Past Surgical History:  ?Procedure Laterality Date  ? AUGMENTATION MAMMAPLASTY Bilateral   ? Breast augmentation with breast lift  ? COLONOSCOPY  03/03/2021  ? [facility] at [facility]  ? index finger  Right   ? WISDOM TOOTH EXTRACTION    ? x4 removed  ? ?Social History  ? ?Occupational History  ? Occupation: midwife  ?Tobacco Use  ? Smoking status: Never  ? Smokeless tobacco: Never  ?Vaping Use  ? Vaping Use: Never used  ?Substance and Sexual Activity  ? Alcohol use: Yes  ?  Comment: rarely  ? Drug use: Never  ? Sexual activity: Yes  ?  Partners: Male  ?  Birth control/protection: I.U.D.  ?  Comment: Husband has absence of Vas Deferens  ? ? ? ? ? ? ?

## 2021-07-23 NOTE — H&P (View-Only) (Signed)
? ?Office Visit Note ?  ?Patient: Pamela Williamson           ?Date of Birth: 10/02/1975           ?MRN: [id number] ?Visit Date: 07/23/2021 ?             ?Requested by: Cyril Loosen, NP ?[address] ?[place],  [place] [phone] ?PCP: Cyril Loosen, NP ? ? ?Assessment & Plan: ?Visit Diagnoses:  ?1. Closed displaced fracture of middle phalanx of left little finger, initial encounter   ?2. Closed nondisplaced fracture of proximal phalanx of left ring finger, initial encounter   ? ? ?Plan: We reviewed the x-rays of her left hand showing a nondisplaced oblique fracture through the ring finger proximal phalanx and a displaced, extra-articular fracture of the distal aspect of the small finger middle phalanx.  She has no clinical malrotation of the ring finger with attempted flexion.  We discussed that the small finger middle phalanx fracture would benefit from close reduction and percutaneous pinning to restore bony alignment and maximize potential functional outcome.  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  We discussed the timeline of normal phalangeal fracture healing.  We discussed the potential role of hand therapy and restoring range of motion.  We discussed the risk of surgery including bleeding, pin tract infection, fracture nonunion or malunion, and need for additional surgery.  She is not sure she is interested in pursuing surgical fixation of the fracture at this time.  She was placed in a dorsal extension splint at the small finger for immobilization.  She is going to reach back out to me if she would like to pursue surgical treatment. ? ?Follow-Up Instructions: No follow-ups on file.  ? ?Orders:  ?Orders Placed This Encounter  ?Procedures  ? XR Hand Complete Left  ? ?No orders of the defined types were placed in this encounter. ? ? ? ? Procedures: ?No procedures performed ? ? ?Clinical  Data: ?No additional findings. ? ? ?Subjective: ?Chief Complaint  ?Patient presents with  ? Left Hand - Fracture  ?  Right handed, +n/t, swelling, no pain, states that her dog pulled her and she had the leash in the left hand. Was seen at [place] in [facility]  ? ? ?This is a 46 year old right-hand-dominant female who was walking her dog last week when her neighbors dog came off the leash.  She was using her left hand restrain her own dog and had the leash wrapped around her hand.  She was seen in [place] urgent [facility] [place] where x-rays were obtained which demonstrated a nondisplaced oblique fracture through the ring finger proximal phalanx and displaced fracture of the small finger middle phalanx.  She was placed in a removable ulnar gutter brace.  She presents today for follow-up.  She still has pain mostly at the small finger.  She has limited range of motion of this finger secondary to pain and stiffness.  She denies other injury elsewhere in her hand. ? ? ?Review of Systems ? ? ?Objective: ?Vital Signs: BP 123/86 (BP Location: Right Arm, Patient Position: Sitting)   Pulse 97   Ht '5\' 7"'$  (1.702 m)   Wt 140 lb (63.5 kg)   BMI 21.93 kg/m?  ? ?Physical Exam ?Constitutional:   ?   Appearance: Normal appearance.  ?Cardiovascular:  ?   Rate  and Rhythm: Normal rate.  ?   Pulses: Normal pulses.  ?Pulmonary:  ?   Effort: Pulmonary effort is normal.  ?Skin: ?   General: Skin is warm and dry.  ?   Capillary Refill: Capillary refill takes less than 2 seconds.  ?Neurological:  ?   Mental Status: She is alert.  ? ? ?Left Hand Exam  ? ?Tenderness  ?Left hand tenderness location: TTP at ring and small fingers.  ? ?Other  ?Erythema: absent ?Sensation: normal ?Pulse: present ? ?Comments:  Ecchymosis involving ring and small fingers with extension to ulnar palm.  Limited ROM of ring finger secondary to immobilization.  No evidence of malrotation or ring finger within her limited ROM. No ROM of small  finger DIP joint secondary to pain.  ? ? ? ? ?Specialty Comments:  ?No specialty comments available. ? ?Imaging: ?No results found. ? ? ?PMFS History: ?Patient Active Problem List  ? Diagnosis Date Noted  ? IUD (intrauterine device) in place 11/07/2018  ? Abnormal uterine bleeding (AUB) 11/07/2018  ? ?Past Medical History:  ?Diagnosis Date  ? Allergy   ? Depression   ? GERD (gastroesophageal reflux disease)   ? IBS (irritable bowel syndrome)   ?  ?Family History  ?Problem Relation Age of Onset  ? Hypertension Mother   ? Hyperlipidemia Mother   ? Diabetes Mother   ? Obesity Mother   ? Colon polyps Father   ? Parkinson's disease Father   ? Hypothyroidism Sister   ? Arthritis/Rheumatoid Sister   ? Hypertension Sister   ? Breast cancer Maternal Grandmother   ? Colon cancer Neg Hx   ? Esophageal cancer Neg Hx   ? Rectal cancer Neg Hx   ? Stomach cancer Neg Hx   ?  ?Past Surgical History:  ?Procedure Laterality Date  ? AUGMENTATION MAMMAPLASTY Bilateral   ? Breast augmentation with breast lift  ? COLONOSCOPY  03/03/2021  ? [facility] at [facility]  ? index finger  Right   ? WISDOM TOOTH EXTRACTION    ? x4 removed  ? ?Social History  ? ?Occupational History  ? Occupation: midwife  ?Tobacco Use  ? Smoking status: Never  ? Smokeless tobacco: Never  ?Vaping Use  ? Vaping Use: Never used  ?Substance and Sexual Activity  ? Alcohol use: Yes  ?  Comment: rarely  ? Drug use: Never  ? Sexual activity: Yes  ?  Partners: Male  ?  Birth control/protection: I.U.D.  ?  Comment: Husband has absence of Vas Deferens  ? ? ? ? ? ? ?

## 2021-07-24 ENCOUNTER — Other Ambulatory Visit: Payer: Self-pay

## 2021-07-24 ENCOUNTER — Encounter (HOSPITAL_BASED_OUTPATIENT_CLINIC_OR_DEPARTMENT_OTHER): Payer: Self-pay | Admitting: Orthopedic Surgery

## 2021-07-27 ENCOUNTER — Ambulatory Visit (HOSPITAL_BASED_OUTPATIENT_CLINIC_OR_DEPARTMENT_OTHER)
Admission: RE | Admit: 2021-07-27 | Discharge: 2021-07-27 | Disposition: A | Discharge status: Home / Self Care | Payer: 59 | Attending: Orthopedic Surgery | Admitting: Orthopedic Surgery

## 2021-07-27 ENCOUNTER — Encounter (HOSPITAL_BASED_OUTPATIENT_CLINIC_OR_DEPARTMENT_OTHER): Payer: Self-pay | Admitting: Orthopedic Surgery

## 2021-07-27 ENCOUNTER — Other Ambulatory Visit: Payer: Self-pay

## 2021-07-27 ENCOUNTER — Ambulatory Visit (HOSPITAL_BASED_OUTPATIENT_CLINIC_OR_DEPARTMENT_OTHER): Payer: 59 | Admitting: Anesthesiology

## 2021-07-27 ENCOUNTER — Encounter (HOSPITAL_BASED_OUTPATIENT_CLINIC_OR_DEPARTMENT_OTHER)
Admission: RE | Disposition: A | Discharge status: Home / Self Care | Payer: Self-pay | Source: Home / Self Care | Attending: Orthopedic Surgery

## 2021-07-27 ENCOUNTER — Ambulatory Visit (HOSPITAL_BASED_OUTPATIENT_CLINIC_OR_DEPARTMENT_OTHER): Payer: 59

## 2021-07-27 DIAGNOSIS — Y93K1 Activity, walking an animal: Secondary | ICD-10-CM | POA: Diagnosis not present

## 2021-07-27 DIAGNOSIS — F32A Depression, unspecified: Secondary | ICD-10-CM | POA: Diagnosis not present

## 2021-07-27 DIAGNOSIS — K219 Gastro-esophageal reflux disease without esophagitis: Secondary | ICD-10-CM | POA: Diagnosis not present

## 2021-07-27 DIAGNOSIS — S62645A Nondisplaced fracture of proximal phalanx of left ring finger, initial encounter for closed fracture: Secondary | ICD-10-CM | POA: Insufficient documentation

## 2021-07-27 DIAGNOSIS — S62627A Displaced fracture of medial phalanx of left little finger, initial encounter for closed fracture: Secondary | ICD-10-CM | POA: Insufficient documentation

## 2021-07-27 DIAGNOSIS — X509XXA Other and unspecified overexertion or strenuous movements or postures, initial encounter: Secondary | ICD-10-CM | POA: Insufficient documentation

## 2021-07-27 HISTORY — PX: CLOSED REDUCTION FINGER WITH PERCUTANEOUS PINNING: SHX5612

## 2021-07-27 LAB — POCT PREGNANCY, URINE: Preg Test, Ur: NEGATIVE

## 2021-07-27 SURGERY — CLOSED REDUCTION, FINGER, WITH PERCUTANEOUS PINNING
Anesthesia: Monitor Anesthesia Care | Site: Finger | Laterality: Left

## 2021-07-27 MED ORDER — DEXMEDETOMIDINE (PRECEDEX) IN NS 20 MCG/5ML (4 MCG/ML) IV SYRINGE
PREFILLED_SYRINGE | INTRAVENOUS | Status: DC | PRN
  Administered 2021-07-27: 12 ug via INTRAVENOUS

## 2021-07-27 MED ORDER — LIDOCAINE HCL (PF) 1 % IJ SOLN
INTRAMUSCULAR | Status: DC | PRN
  Administered 2021-07-27: 3 mL

## 2021-07-27 MED ORDER — OXYCODONE HCL 5 MG PO TABS: 5.0000 mg | ORAL_TABLET | Freq: Once | ORAL | Status: DC | PRN

## 2021-07-27 MED ORDER — FENTANYL CITRATE (PF) 100 MCG/2ML IJ SOLN
INTRAMUSCULAR | Status: AC
  Filled 2021-07-27: qty 2

## 2021-07-27 MED ORDER — FENTANYL CITRATE (PF) 100 MCG/2ML IJ SOLN
25.0000 ug | INTRAMUSCULAR | Status: DC | PRN
  Administered 2021-07-27: 25 ug via INTRAVENOUS
  Administered 2021-07-27: 50 ug via INTRAVENOUS

## 2021-07-27 MED ORDER — ACETAMINOPHEN 500 MG PO TABS
1000.0000 mg | ORAL_TABLET | Freq: Once | ORAL | Status: AC
  Administered 2021-07-27: 1000 mg via ORAL

## 2021-07-27 MED ORDER — LACTATED RINGERS IV SOLN: INTRAVENOUS | Status: DC

## 2021-07-27 MED ORDER — ONDANSETRON HCL 4 MG/2ML IJ SOLN: 4.0000 mg | Freq: Once | INTRAMUSCULAR | Status: DC | PRN

## 2021-07-27 MED ORDER — BUPIVACAINE HCL (PF) 0.25 % IJ SOLN
INTRAMUSCULAR | Status: AC
  Filled 2021-07-27: qty 30

## 2021-07-27 MED ORDER — CEFAZOLIN SODIUM-DEXTROSE 2-4 GM/100ML-% IV SOLN
INTRAVENOUS | Status: AC
  Filled 2021-07-27: qty 100

## 2021-07-27 MED ORDER — BUPIVACAINE HCL (PF) 0.25 % IJ SOLN
INTRAMUSCULAR | Status: DC | PRN
  Administered 2021-07-27: 3 mL

## 2021-07-27 MED ORDER — FENTANYL CITRATE (PF) 100 MCG/2ML IJ SOLN
INTRAMUSCULAR | Status: DC | PRN
  Administered 2021-07-27: 50 ug via INTRAVENOUS

## 2021-07-27 MED ORDER — LIDOCAINE 2% (20 MG/ML) 5 ML SYRINGE
INTRAMUSCULAR | Status: DC | PRN
Start: 2021-07-27 — End: 2021-07-27
  Administered 2021-07-27: 60 mg via INTRAVENOUS

## 2021-07-27 MED ORDER — OXYCODONE HCL 5 MG PO TABS
5.0000 mg | ORAL_TABLET | Freq: Four times a day (QID) | ORAL | 0 refills | Status: AC | PRN
Start: 2021-07-27 — End: 2021-08-03

## 2021-07-27 MED ORDER — CEFAZOLIN SODIUM-DEXTROSE 2-4 GM/100ML-% IV SOLN
2.0000 g | INTRAVENOUS | Status: AC
  Administered 2021-07-27: 2 g via INTRAVENOUS

## 2021-07-27 MED ORDER — AMISULPRIDE (ANTIEMETIC) 5 MG/2ML IV SOLN: 10.0000 mg | Freq: Once | INTRAVENOUS | Status: DC | PRN

## 2021-07-27 MED ORDER — MIDAZOLAM HCL 5 MG/5ML IJ SOLN
INTRAMUSCULAR | Status: DC | PRN
Start: 2021-07-27 — End: 2021-07-27
  Administered 2021-07-27: 2 mg via INTRAVENOUS

## 2021-07-27 MED ORDER — LIDOCAINE HCL (PF) 1 % IJ SOLN
INTRAMUSCULAR | Status: AC
  Filled 2021-07-27: qty 30

## 2021-07-27 MED ORDER — OXYCODONE HCL 5 MG/5ML PO SOLN: 5.0000 mg | Freq: Once | ORAL | Status: DC | PRN

## 2021-07-27 MED ORDER — ONDANSETRON HCL 4 MG/2ML IJ SOLN
INTRAMUSCULAR | Status: DC | PRN
  Administered 2021-07-27: 4 mg via INTRAVENOUS

## 2021-07-27 MED ORDER — MIDAZOLAM HCL 2 MG/2ML IJ SOLN
INTRAMUSCULAR | Status: AC
  Filled 2021-07-27: qty 2

## 2021-07-27 MED ORDER — PROPOFOL 500 MG/50ML IV EMUL
INTRAVENOUS | Status: DC | PRN
  Administered 2021-07-27: 75 ug/kg/min via INTRAVENOUS

## 2021-07-27 MED ORDER — ACETAMINOPHEN 500 MG PO TABS
ORAL_TABLET | ORAL | Status: AC
  Filled 2021-07-27: qty 2

## 2021-07-27 SURGICAL SUPPLY — 43 items
APL PRP STRL LF DISP 70% ISPRP (MISCELLANEOUS) ×1
BLADE SURG 15 STRL LF DISP TIS (BLADE) ×1 IMPLANT
BLADE SURG 15 STRL SS (BLADE) ×2
BNDG CMPR 9X4 STRL LF SNTH (GAUZE/BANDAGES/DRESSINGS)
BNDG COHESIVE 1X5 TAN STRL LF (GAUZE/BANDAGES/DRESSINGS) ×1 IMPLANT
BNDG ELASTIC 3X5.8 VLCR STR LF (GAUZE/BANDAGES/DRESSINGS) ×1 IMPLANT
BNDG ESMARK 4X9 LF (GAUZE/BANDAGES/DRESSINGS) IMPLANT
BNDG GAUZE ELAST 4 BULKY (GAUZE/BANDAGES/DRESSINGS) ×1 IMPLANT
CHLORAPREP W/TINT 26 (MISCELLANEOUS) ×2 IMPLANT
CORD BIPOLAR FORCEPS 12FT (ELECTRODE) IMPLANT
COVER BACK TABLE 60X90IN (DRAPES) ×2 IMPLANT
COVER MAYO STAND STRL (DRAPES) ×2 IMPLANT
CUFF TOURN SGL QUICK 18X4 (TOURNIQUET CUFF) IMPLANT
CUFF TOURN SGL QUICK 24 (TOURNIQUET CUFF)
CUFF TRNQT CYL 24X4X16.5-23 (TOURNIQUET CUFF) IMPLANT
DRAPE EXTREMITY T 121X128X90 (DISPOSABLE) ×2 IMPLANT
DRAPE OEC MINIVIEW 54X84 (DRAPES) ×2 IMPLANT
DRAPE SURG 17X23 STRL (DRAPES) ×2 IMPLANT
GAUZE SPONGE 4X4 12PLY STRL (GAUZE/BANDAGES/DRESSINGS) ×2 IMPLANT
GAUZE XEROFORM 1X8 LF (GAUZE/BANDAGES/DRESSINGS) ×1 IMPLANT
GLOVE SURG ENC MOIS LTX SZ7 (GLOVE) ×2 IMPLANT
GOWN STRL REUS W/ TWL LRG LVL3 (GOWN DISPOSABLE) ×1 IMPLANT
GOWN STRL REUS W/ TWL XL LVL3 (GOWN DISPOSABLE) ×1 IMPLANT
GOWN STRL REUS W/TWL LRG LVL3 (GOWN DISPOSABLE) ×2
GOWN STRL REUS W/TWL XL LVL3 (GOWN DISPOSABLE) ×2
NDL HYPO 25X1 1.5 SAFETY (NEEDLE) IMPLANT
NEEDLE HYPO 25X1 1.5 SAFETY (NEEDLE) ×2 IMPLANT
NS IRRIG 1000ML POUR BTL (IV SOLUTION) ×1 IMPLANT
PACK BASIN DAY SURGERY FS (CUSTOM PROCEDURE TRAY) ×2 IMPLANT
PAD CAST 3X4 CTTN HI CHSV (CAST SUPPLIES) IMPLANT
PADDING CAST COTTON 3X4 STRL (CAST SUPPLIES)
SLEEVE SCD COMPRESS KNEE MED (STOCKING) IMPLANT
SPLINT PLASTER CAST XFAST 4X15 (CAST SUPPLIES) ×10 IMPLANT
SPLINT PLASTER XTRA FAST SET 4 (CAST SUPPLIES)
SUCTION FRAZIER HANDLE 12FR (TUBING) ×1
SUCTION TUBE FRAZIER 12FR DISP (TUBING) ×1 IMPLANT
SUT ETHILON 4 0 PS 2 18 (SUTURE) ×1 IMPLANT
SUT MNCRL AB 3-0 PS2 18 (SUTURE) ×1 IMPLANT
SUT VICRYL 4-0 PS2 18IN ABS (SUTURE) IMPLANT
SYR BULB EAR ULCER 3OZ GRN STR (SYRINGE) IMPLANT
SYR CONTROL 10ML LL (SYRINGE) ×1 IMPLANT
TOWEL GREEN STERILE FF (TOWEL DISPOSABLE) ×4 IMPLANT
UNDERPAD 30X36 HEAVY ABSORB (UNDERPADS AND DIAPERS) ×2 IMPLANT

## 2021-07-27 NOTE — Anesthesia Postprocedure Evaluation (Signed)
Anesthesia Post Note ? ?Patient: NECOLA BLUESTEIN ? ?Procedure(s) Performed: LEFT SMALL FINGER CLOSED REDUCTION FINGER WITH PERCUTANEOUS PINNING (Left: Finger) ? ?  ? ?Patient location during evaluation: PACU ?Anesthesia Type: MAC ?Level of consciousness: awake and alert ?Pain management: pain level controlled ?Vital Signs Assessment: post-procedure vital signs reviewed and stable ?Respiratory status: spontaneous breathing and respiratory function stable ?Cardiovascular status: stable ?Postop Assessment: no apparent nausea or vomiting ?Anesthetic complications: no ? ? ?No notable events documented. ? ?Last Vitals:  ?Vitals:  ? 07/27/21 1430 07/27/21 1435  ?BP: 127/77   ?Pulse: (!) 55 61  ?Resp: 15 11  ?Temp:    ?SpO2: 100% 100%  ?  ?Last Pain:  ?Vitals:  ? 07/27/21 1435  ?TempSrc:   ?PainSc: 5   ? ? ?  ?  ?  ?  ?  ?  ? ?Merlinda Frederick ? ? ? ? ?

## 2021-07-27 NOTE — Transfer of Care (Signed)
Immediate Anesthesia Transfer of Care Note ? ?Patient: Pamela Williamson ? ?Procedure(s) Performed: LEFT SMALL FINGER CLOSED REDUCTION FINGER WITH PERCUTANEOUS PINNING (Left: Finger) ? ?Patient Location: PACU ? ?Anesthesia Type:MAC ? ?Level of Consciousness: awake, alert , oriented and patient cooperative ? ?Airway & Oxygen Therapy: Patient Spontanous Breathing and Patient connected to face mask oxygen ? ?Post-op Assessment: Report given to RN and Post -op Vital signs reviewed and stable ? ?Post vital signs: Reviewed and stable ? ?Last Vitals:  ?Vitals Value Taken Time  ?BP    ?Temp    ?Pulse 84 07/27/21 1351  ?Resp 20 07/27/21 1351  ?SpO2 100 % 07/27/21 1351  ?Vitals shown include unvalidated device data. ? ?Last Pain:  ?Vitals:  ? 07/27/21 1126  ?TempSrc: Oral  ?PainSc: 0-No pain  ?   ? ?  ? ?Complications: No notable events documented. ?

## 2021-07-27 NOTE — Anesthesia Procedure Notes (Signed)
Procedure Name: [place] ?Date/Time: 07/27/2021 1:12 PM ?Performed by: Signe Colt, CRNA ?Pre-anesthesia Checklist: Patient identified, Emergency Drugs available, Suction available, Patient being monitored and Timeout performed ?Patient Re-evaluated:Patient Re-evaluated prior to induction ?Oxygen Delivery Method: Simple face mask ? ? ? ? ?

## 2021-07-27 NOTE — Discharge Instructions (Addendum)
? ? ?Pamela Williamson, M.D. ?Hand Surgery ? ?POST-OPERATIVE DISCHARGE INSTRUCTIONS ? ? ?PRESCRIPTIONS: ?You have been given a prescription to be taken as directed for post-operative pain control.  You may also take over the counter ibuprofen/aleve and tylenol for pain. Take this as directed on the packaging. Do not exceed 3000 mg tylenol/acetaminophen in 24 hours. ? ?Ibuprofen 600-800 mg (3-4) tablets by mouth every 6 hours as needed for pain.  ?OR ?Aleve 2 tablets by mouth every 12 hours (twice daily) as needed for pain.  ?AND/OR ?Tylenol 1000 mg (2 tablets) every 8 hours as needed for pain.   Last dose of Tylenol given at 11:30am.  ? ?Please use your pain medication carefully, as refills are limited and you may not be provided with one.  As stated above, please use over the counter pain medicine - it will also be helpful with decreasing your swelling.  ? ? ?ANESTHESIA: ?After your surgery, post-surgical discomfort or pain is likely. This discomfort can last several days to a few weeks. At certain times of the day your discomfort may be more intense.  ? ?Did you receive a nerve block?  ?A nerve block can provide pain relief for one hour to two days after your surgery. As long as the nerve block is working, you will experience little or no sensation in the area the surgeon operated on.  ?As the nerve block wears off, you will begin to experience pain or discomfort. It is very important that you begin taking your prescribed pain medication before the nerve block fully wears off. Treating your pain at the first sign of the block wearing off will ensure your pain is better controlled and more tolerable when full-sensation returns. Do not wait until the pain is intolerable, as the medicine will be less effective. It is better to treat pain in advance than to try and catch up.  ? ?General Anesthesia:  ?If you did not receive a nerve block during your surgery, you will need to start taking your pain medication shortly  after your surgery and should continue to do so as prescribed by your surgeon.   ? ? ?ICE AND ELEVATION: ?You may use ice for the first 48-72 hours, but it is not critical.   ?Elevation, as much as possible for the next 48 hours, is critical for decreasing swelling as well as for pain relief. Elevation means when you are seated or lying down, you hand should be at or above your heart. When walking, the hand needs to be at or above the level of your elbow.  ?If the bandage gets too tight, it may need to be loosened. Please contact our office and we will instruct you in how to do this.  ? ? ?SURGICAL BANDAGES:  ?Keep your dressing and/or splint clean and dry at all times.  Do not remove until you are seen again in the office.  If careful, you may place a plastic bag over your bandage and tape the end to shower, but be careful, do not get your bandages wet.  ?  ? ?HAND THERAPY:  ?A referral has been made to see our hand therapist to work on edema control, maintaining ROM, and preventing stiffness.  ? ? ?ACTIVITY AND WORK: ?You are encouraged to move any fingers which are not in the bandage.  ?Light use of the fingers is allowed to assist the other hand with daily hygiene and eating, but strong gripping or lifting is often uncomfortable and should be avoided.  ?  You might miss a variable period of time from work and hopefully this issue has been discussed prior to surgery. You may not do any heavy work with your affected hand for about 2 weeks.  ? ? ?[facility] ?[address] ?[place],  [place]  [address] ?[phone]  ? ? ?Post Anesthesia Home Care Instructions ? ?Activity: ?Get plenty of rest for the remainder of the day. A responsible individual must stay with you for 24 hours following the procedure.  ?For the next 24 hours, DO NOT: ?-Drive a car ?-Paediatric nurse ?-Drink alcoholic beverages ?-Take any medication unless instructed by your physician ?-Make any legal decisions or sign  important papers. ? ?Meals: ?Start with liquid foods such as gelatin or soup. Progress to regular foods as tolerated. Avoid greasy, spicy, heavy foods. If nausea and/or vomiting occur, drink only clear liquids until the nausea and/or vomiting subsides. Call your physician if vomiting continues. ? ?Special Instructions/Symptoms: ?Your throat may feel dry or sore from the anesthesia or the breathing tube placed in your throat during surgery. If this causes discomfort, gargle with warm salt water. The discomfort should disappear within 24 hours. ? ?If you had a scopolamine patch placed behind your ear for the management of post- operative nausea and/or vomiting: ? ?1. The medication in the patch is effective for 72 hours, after which it should be removed.  Wrap patch in a tissue and discard in the trash. Wash hands thoroughly with soap and water. ?2. You may remove the patch earlier than 72 hours if you experience unpleasant side effects which may include dry mouth, dizziness or visual disturbances. ?3. Avoid touching the patch. Wash your hands with soap and water after contact with the patch. ?    ?

## 2021-07-27 NOTE — Brief Op Note (Signed)
07/27/2021 ? ?1:45 PM ? ?PATIENT:  Pamela Williamson  46 y.o. female ? ?PRE-OPERATIVE DIAGNOSIS:  LEFT SMALL FINGER MIDDLE PHALANX FRACTURE ? ?POST-OPERATIVE DIAGNOSIS:  LEFT SMALL FINGER MIDDLE PHALANX FRACTURE ? ?PROCEDURE:  Procedure(s): ?LEFT SMALL FINGER CLOSED REDUCTION FINGER WITH PERCUTANEOUS PINNING (Left) ? ?SURGEON:  Surgeon(s) and Role: ?   * Sherilyn Cooter, MD - Primary ? ?PHYSICIAN ASSISTANT:  ? ?ASSISTANTS: none  ? ?ANESTHESIA:   local and MAC ? ?EBL:  <5 cc  ? ?BLOOD ADMINISTERED:none ? ?DRAINS: none  ? ?LOCAL MEDICATIONS USED:  MARCAINE    and XYLOCAINE  ? ?SPECIMEN:  No Specimen ? ?DISPOSITION OF SPECIMEN:  N/A ? ?COUNTS:  YES ? ?TOURNIQUET:  * Missing tourniquet times found for documented tourniquets in log: [id number] * ? ?DICTATION: .Dragon Dictation ? ?PLAN OF CARE: Discharge to home after PACU ? ?PATIENT DISPOSITION:  PACU - hemodynamically stable. ?  ?Delay start of Pharmacological VTE agent (>24hrs) due to surgical blood loss or risk of bleeding: not applicable ? ?

## 2021-07-27 NOTE — Interval H&P Note (Signed)
History and Physical Interval Note: ? ?07/27/2021 ?12:45 PM ? ?Pamela Williamson  has presented today for surgery, with the diagnosis of LEFT SMALL FINGER MIDDLE PHALANX FRACTURE.  The various methods of treatment have been discussed with the patient and family. After consideration of risks, benefits and other options for treatment, the patient has consented to  Procedure(s): ?LEFT SMALL FINGER CLOSED REDUCTION FINGER WITH PERCUTANEOUS PINNING (Left) as a surgical intervention.  The patient's history has been reviewed, patient examined, no change in status, stable for surgery.  I have reviewed the patient's chart and labs.  Questions were answered to the patient's satisfaction.   ? ? ?Pamela Williamson ? ? ?

## 2021-07-27 NOTE — Op Note (Signed)
? ?  Date of Surgery: 07/27/2021 ? ?INDICATIONS: Ms. Cancro is a 46 y.o.-year-old female with a closed left small finger middle phalangeal neck fracture after being pulled by the leash while walking her dog.  She also has a nondisplaced left ring finger proximal phalanx fracture that is completely nondisplaced and that we are treating conservatively.  Risks, benefits, and alternatives to surgery were again discussed with the patient wishing to proceed with surgery.  Informed consent was signed after our discussion.  ? ?PREOPERATIVE DIAGNOSIS:  ?Left small finger middle phalangeal neck fracture ? ?POSTOPERATIVE DIAGNOSIS: Same. ? ?PROCEDURE:  ?Closed reduction and percutaneous pinning of left small finger middle phalanx fracture ? ? ?SURGEON: Audria Nine, M.D. ? ?ASSIST:  ? ?ANESTHESIA:  Local, MAC ? ?IV FLUIDS AND URINE: See anesthesia. ? ?ESTIMATED BLOOD LOSS: <5 mL. ? ?IMPLANTS: * No implants in log *  ? ?DRAINS: None  ? ?COMPLICATIONS: None ? ?DESCRIPTION OF PROCEDURE: The patient was met in the preoperative holding area where the surgical site was marked and the consent form was verified.  The patient was then taken to the operating room and transferred to the operating table.  All bony prominences were well padded.  Monitored sedation was induced.  A formal time-out was performed to confirm that this was the correct patient, surgery, side, and site. A digital block was performed using 5cc of 1% lidocaine and 0.25% marcaine. The operative extremity was prepped and draped in the usual and sterile fashion.   ? ?Following timeout, the mini fluoroscopy machine was brought in.  The fracture was reduced with direct volar pressure on the middle phalangeal neck.  A 0.045" k-wire was then advanced in a retrograde fashion across the DIP joint into the subchondral bone of the middle phalangeal base.  Given the oblique nature of the fracture, a second 0.035" k-wire was advanced from the ulnar aspect of the middle  phalangeal head, oblique to the fracture line, and through the radial cortex of the proximal segment. Fluoroscopy demonstrated appropriate fracture reduction and k-wire position.  The k-wires were then bent and cut.  The wires were dressed with xeroform bolsters at the pin sites.  The finger was then dressed with a 4x4, Coban, and a dorsal alumifoam splint. ? ?The patient was then reversed from sedation and transferred to the postoperative bed.  All counts were correct x 2 at the end of the procedure.  She was then taken to the PACU in stable condition.  ? ?POSTOPERATIVE PLAN: She will be discharged to home with appropriate pain medication and discharge instructions.  I'll see her back in 10-14 days for her first postop visit.  A hand therapy referral has been placed to work on edema control and to prevent postoperative stiffness.  ? ?Audria Nine, MD ?1:50 PM  ?

## 2021-07-27 NOTE — Anesthesia Preprocedure Evaluation (Addendum)
Anesthesia Evaluation  ?Patient identified by MRN, date of birth, ID band ?Patient awake ? ? ? ?Reviewed: ?Allergy & Precautions, NPO status , Patient's Chart, lab work & pertinent test results ? ?Airway ?Mallampati: II ? ?TM Distance: >3 FB ?Neck ROM: Full ? ?Mouth opening: Limited Mouth Opening ? Dental ?no notable dental hx. ? ?  ?Pulmonary ?neg pulmonary ROS,  ?  ?Pulmonary exam normal ?breath sounds clear to auscultation ? ? ? ? ? ? Cardiovascular ?negative cardio ROS ?Normal cardiovascular exam ?Rhythm:Regular Rate:Normal ? ? ?  ?Neuro/Psych ?PSYCHIATRIC DISORDERS Depression negative neurological ROS ?   ? GI/Hepatic ?Neg liver ROS, GERD  ,  ?Endo/Other  ?negative endocrine ROS ? Renal/GU ?negative Renal ROS  ?negative genitourinary ?  ?Musculoskeletal ?negative musculoskeletal ROS ?(+)  ? Abdominal ?  ?Peds ?negative pediatric ROS ?(+)  Hematology ?negative hematology ROS ?(+)   ?Anesthesia Other Findings ? ? Reproductive/Obstetrics ?negative OB ROS ? ?  ? ? ? ? ? ? ? ? ? ? ? ? ? ?  ?  ? ? ? ? ? ? ? ?Anesthesia Physical ?Anesthesia Plan ? ?ASA: 2 ? ?Anesthesia Plan: MAC  ? ?Post-op Pain Management: Minimal or no pain anticipated and Tylenol PO (pre-op)*  ? ?Induction: Intravenous ? ?PONV Risk Score and Plan: 2 and Treatment may vary due to age or medical condition, Ondansetron, Dexamethasone, Midazolam and Propofol infusion ? ?Airway Management Planned: Natural Airway ? ?Additional Equipment: None ? ?Intra-op Plan:  ? ?Post-operative Plan:  ? ?Informed Consent: I have reviewed the patients History and Physical, chart, labs and discussed the procedure including the risks, benefits and alternatives for the proposed anesthesia with the patient or authorized representative who has indicated his/her understanding and acceptance.  ? ? ? ? ? ?Plan Discussed with: Anesthesiologist, CRNA and Surgeon ? ?Anesthesia Plan Comments: (Local by surgeon. MAC with propofol gtt. GA/LMA as  backup plan. Norton Blizzard, MD  ?)  ? ? ? ? ?Anesthesia Quick Evaluation ? ?

## 2021-07-29 ENCOUNTER — Encounter (HOSPITAL_BASED_OUTPATIENT_CLINIC_OR_DEPARTMENT_OTHER): Payer: Self-pay | Admitting: Orthopedic Surgery

## 2021-08-06 ENCOUNTER — Ambulatory Visit (INDEPENDENT_AMBULATORY_CARE_PROVIDER_SITE_OTHER): Payer: 59 | Admitting: Orthopedic Surgery

## 2021-08-06 ENCOUNTER — Other Ambulatory Visit: Payer: Self-pay

## 2021-08-06 ENCOUNTER — Encounter: Payer: Self-pay | Admitting: Orthopedic Surgery

## 2021-08-06 ENCOUNTER — Ambulatory Visit: Payer: Self-pay

## 2021-08-06 ENCOUNTER — Encounter: Payer: Self-pay | Admitting: Rehabilitative and Restorative Service Providers"

## 2021-08-06 ENCOUNTER — Ambulatory Visit: Payer: 59 | Admitting: Rehabilitative and Restorative Service Providers"

## 2021-08-06 DIAGNOSIS — M6281 Muscle weakness (generalized): Secondary | ICD-10-CM | POA: Diagnosis not present

## 2021-08-06 DIAGNOSIS — M25642 Stiffness of left hand, not elsewhere classified: Secondary | ICD-10-CM

## 2021-08-06 DIAGNOSIS — Z9889 Other specified postprocedural states: Secondary | ICD-10-CM

## 2021-08-06 DIAGNOSIS — M79642 Pain in left hand: Secondary | ICD-10-CM | POA: Diagnosis not present

## 2021-08-06 DIAGNOSIS — S62627A Displaced fracture of medial phalanx of left little finger, initial encounter for closed fracture: Secondary | ICD-10-CM

## 2021-08-06 NOTE — Therapy (Signed)
?OUTPATIENT OCCUPATIONAL THERAPY ORTHO EVALUATION ? ?Patient Name: Pamela Williamson ?MRN: [id number] ?DOB:1976/04/24, 46 y.o., female ?Today's Date: 08/06/2021 ? ?PCP: Cyril Loosen, NP ?REFERRING PROVIDER: Sherilyn Cooter, MD ? ? OT End of Session - 08/06/21 1027   ? ? Visit Number 1   ? Number of Visits 12   ? Date for OT Re-Evaluation 09/18/21   ? OT Start Time 1028   ? OT Stop Time 1113   ? OT Time Calculation (min) 45 min   ? Equipment Utilized During Treatment orthotic materials   ? Activity Tolerance Patient tolerated treatment well;No increased pain;Patient limited by pain   ? Behavior During Therapy [facility] for tasks assessed/performed   ? ?  ?  ? ?  ? ? ?Past Medical History:  ?Diagnosis Date  ? Allergy   ? Depression   ? GERD (gastroesophageal reflux disease)   ? IBS (irritable bowel syndrome)   ? ?Past Surgical History:  ?Procedure Laterality Date  ? AUGMENTATION MAMMAPLASTY Bilateral   ? Breast augmentation with breast lift  ? CLOSED REDUCTION FINGER WITH PERCUTANEOUS PINNING Left 07/27/2021  ? Procedure: LEFT SMALL FINGER CLOSED REDUCTION FINGER WITH PERCUTANEOUS PINNING;  Surgeon: Sherilyn Cooter, MD;  Location: [facility];  Service: Orthopedics;  Laterality: Left;  ? COLONOSCOPY  03/03/2021  ? [facility]  ? index finger  Right   ? WISDOM TOOTH EXTRACTION    ? x4 removed  ? ?Patient Active Problem List  ? Diagnosis Date Noted  ? Closed displaced fracture of middle phalanx of left little finger 07/23/2021  ? Nondisplaced fracture of proximal phalanx of left ring finger, initial encounter for closed fracture 07/23/2021  ? IUD (intrauterine device) in place 11/07/2018  ? Abnormal uterine bleeding (AUB) 11/07/2018  ? ? ?ONSET DATE: 07/27/21 DOS ? ?REFERRING DIAG: F35.456Y (ICD-10-CM) - Closed displaced fracture of middle phalanx of left little finger, initial encounter ? ?THERAPY DIAG:  ?Pain in left hand ? ?Muscle weakness (generalized) ? ?Stiffness of left hand, not elsewhere  classified ? ?SUBJECTIVE:  ? ?SUBJECTIVE STATEMENT: ?She states she is a bit "woozy" from looking at pins, she has a worried look on her face at entrance. 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 She states walking her dog and the leash pulled, breaking her hand and she fell.  ? ?PERTINENT HISTORY: Now 1.5 weeks post-op. Per MD: "Left small finger P2 fracture s/p CRPP.  Left ring finger P1 fracture being treated nonoperatively.  Therapy to work on edema control and prevent postoperative stiffness. Possible splint for left small finger to immobilize DIPJ." ? ?PRECAUTIONS: NWB in Lt hand for now, no AROM until 3-4 weeks (once healed), A/AAROM after 5-6 weeks (pin out), bracing until 8-10 weeks  ? ?PAIN:  ?Are you having pain? No pain at rest, up to 6/10 at worst in past week, throbbing, numb at times.  ? ?FALLS: Has patient fallen in last 6 months? Yes. Number of falls 1 (this accident)  ? ?LIVING ENVIRONMENT: ?Lives with: lives with their family ? ?PLOF: Independent ? ?PATIENT GOALS To get hand bette rto return back to work ? ?OBJECTIVE:  ? ?HAND DOMINANCE: Right ? ?ADLs: ?Overall ADLs: difficulty and problems opening containers, lifting/carry objects, doing work tasks like typing, delivering babies, etc.  ? ? ?FUNCTIONAL OUTCOME MEASURES: ?Quick Dash: 43% impairment today ? ?UE ROM   Fingers very straight today at rest/passively (no lags), but motion not indicated for IPJs yet.at [facility]. Detailed measures may be taken next  session. Non-involved fingers look great, minimal swelling, seem fairly flexible.  ? ?Active ROM Left ?08/06/2021  ?Wrist flexion 69  ?Wrist extension 69  ?(Blank rows = not tested) ? ?Active ROM Left ?08/06/2021  ?Ring MCP (0-90) 90   ?Ring PIP (0-100)    ?Ring DIP (0-70)    ?Little MCP (0-90) 90   ?Little PIP (0-100)    ?Little DIP (0-70)    ?(Blank rows = not tested) ? ? ?UE MMT:   NT at eval ? ? ?HAND FUNCTION: ?Grip strength: Right: 77# lbs; Left: TBD lbs ? ?COORDINATION: ?Overtly limited at eval,  cannot oppose to SF, cannot make full fist. ? ?SENSATION: ?Pt c/o tingling/numbness intermittently, this will be further addressed if no resolution when swelling decreases ? ?EDEMA: minimal in RF and SF today, other fingers not very effected  ? ?COGNITION: ?Overall cognitive status: Within functional limits for tasks assessed though very apparently anxious ? ?OBSERVATIONS: Pt anxious, tearful at times, states she has been using her hand for activities already, some concerning things for overuse of healing fxs ? ? ?TODAY'S TREATMENT:  ?08/06/21 Eval: Custom orthotic fabrication was indicated due to pt's healing L SF P2 & RF P1 fxs and need for safe, functional positioning. OT fabricated custom hand-based PIP, DIP ext orthotic for pt today to allow healing and prevent PIP J flexion contractures. It fit well with no areas of pressure, pt states a comfortable fit. Pt was educated on the wearing schedule, to call or come in ASAP if it is causing any irritation or is not achieving desired function. Care was taken to pad and protect pin sites and prevent direct pressure to pins with strapping. It will be checked/adjusted in upcoming sessions, as needed. Pt states understanding.  ? ?She is also given first HEP education to perform AROM at non-involved joints (shoulder, elbow, wrist, hand) without pain, etc. She states understanding. She is overtly stressed, tearful at times, wishes to be full duty for work and states feeling strangely when looking at pins.  OT emphasizes that we need to avoid flexion contracture at PIP J, but allows her to remove orthotic 4x day, hold SF straight with right hand and slowly work on RF AROM in flex and ext. She states understanding. She is also edu on self-care wrapping fingers to help with edema, padding pin sites, caution with routines for not hurting sites, keeping clean/dry, etc. She is not very happy, but states understanding. She was edu on not lifting more than 3# with left hand for  now.  ? ? ?PATIENT EDUCATION: ?Education details: see tx section above for details  ?Person educated: Patient ?Education method: Explanation and Demonstration ?Education comprehension: verbalized understanding, returned demonstration, and needs further education ? ? ?HOME EXERCISE PROGRAM: ?see tx section above for details  ? ?GOALS: ?Goals reviewed with patient? Yes ? ?SHORT TERM GOALS: (STG required if POC>30 days) ? ?Pt will obtain protective, custom orthotic. ?Target date: 08/06/21 ?Goal status: MET ? ?2.  Pt will demo/state understanding of initial HEP to improve pain levels and prerequisite motion. ?Target date: 08/21/21 ?Goal status: INITIAL ? ? ?LONG TERM GOALS: ? ?Pt will improve functional ability by decreased impairment per Quick DASH assessment from 43% to 10% or better, for better quality of life. ?Target date: 09/25/21 ?Goal status: INITIAL ? ?2.  Pt will improve grip strength in left hand from to at least 40lbs for functional use at home and in IADLs. ?Target date: 09/25/21 ?Goal status: INITIAL ? ?3.  Pt  will improve A/ROM in Lt SF TAM to at least 200*, to have functional motion for tasks like and grasp and hold small objects in hand.  ?Target date: 09/25/21 ?Goal status: INITIAL ? ? ?ASSESSMENT: ? ?CLINICAL IMPRESSION: ?Patient is a 46 y.o. female who was seen today for occupational therapy evaluation for Lt SF p2 fx and RF P1 fxs and decreased functional ability.  ? ?PERFORMANCE DEFICITS in functional skills including ADLs, IADLs, coordination, dexterity, sensation, edema, ROM, strength, pain, fascial restrictions, flexibility, FMC, GMC, body mechanics, endurance, decreased knowledge of precautions, skin integrity, and UE functional use, cognitive skills including problem solving and safety awareness, and psychosocial skills including coping strategies, environmental adaptation, habits, and routines and behaviors.  ? ?IMPAIRMENTS are limiting patient from ADLs, IADLs, rest and sleep, work, play, and  leisure.  ? ?COMORBIDITIES may have co-morbidities  that affects occupational performance. Patient will benefit from skilled OT to address above impairments and improve overall function. ? ?MODIFICATION OR ASSISTA

## 2021-08-06 NOTE — Progress Notes (Signed)
? ?Office Visit Note ?  ?Patient: Pamela Williamson           ?Date of Birth: 1975/07/26           ?MRN: [id number] ?Visit Date: 08/06/2021 ?             ?Requested by: Cyril Loosen, NP ?[address] ?[place],  [place] [id number] ?PCP: Cyril Loosen, NP ? ? ?Assessment & Plan: ?Visit Diagnoses:  ?1. Post-operative state   ?2. Closed displaced fracture of middle phalanx of left little finger, initial encounter   ? ? ?Plan: Patient is 10 days s/p CRPP of her left small finger middle phalanx fracture.  The pin sites are clean and dry.  X-rays show largely maintained fracture alignment with slightly increased shortening at the phalangeal neck from the intraoperative fluoro films.  Discussed with patient that this will likely no have any effect on functional recovery.  She is going to hand therapy today to have a removable splint made start working on edema control and gentle ROM.  I can see her back in another 2-3 weeks.  ? ?Follow-Up Instructions: No follow-ups on file.  ? ?Orders:  ?Orders Placed This Encounter  ?Procedures  ? XR Finger Little Left  ? ?No orders of the defined types were placed in this encounter. ? ? ? ? Procedures: ?No procedures performed ? ? ?Clinical Data: ?No additional findings. ? ? ?Subjective: ?Chief Complaint  ?Patient presents with  ? Left Little Finger - Post-op Follow-up  ? ? ?HPI ? ?Review of Systems ? ? ?Objective: ?Vital Signs: There were no vitals taken for this visit. ? ?Physical Exam ? ?Ortho Exam ? ?Specialty Comments:  ?No specialty comments available. ? ?Imaging: ?No results found. ? ? ?PMFS History: ?Patient Active Problem List  ? Diagnosis Date Noted  ? Closed displaced fracture of middle phalanx of left little finger 07/23/2021  ? Nondisplaced fracture of proximal phalanx of left ring finger, initial encounter for closed fracture 07/23/2021  ? IUD (intrauterine device) in place 11/07/2018  ? Abnormal uterine bleeding (AUB) 11/07/2018  ? ?Past Medical  History:  ?Diagnosis Date  ? Allergy   ? Depression   ? GERD (gastroesophageal reflux disease)   ? IBS (irritable bowel syndrome)   ?  ?Family History  ?Problem Relation Age of Onset  ? Hypertension Mother   ? Hyperlipidemia Mother   ? Diabetes Mother   ? Obesity Mother   ? Colon polyps Father   ? Parkinson's disease Father   ? Hypothyroidism Sister   ? Arthritis/Rheumatoid Sister   ? Hypertension Sister   ? Breast cancer Maternal Grandmother   ? Colon cancer Neg Hx   ? Esophageal cancer Neg Hx   ? Rectal cancer Neg Hx   ? Stomach cancer Neg Hx   ?  ?Past Surgical History:  ?Procedure Laterality Date  ? AUGMENTATION MAMMAPLASTY Bilateral   ? Breast augmentation with breast lift  ? CLOSED REDUCTION FINGER WITH PERCUTANEOUS PINNING Left 07/27/2021  ? Procedure: LEFT SMALL FINGER CLOSED REDUCTION FINGER WITH PERCUTANEOUS PINNING;  Surgeon: Sherilyn Cooter, MD;  Location: [facility];  Service: Orthopedics;  Laterality: Left;  ? COLONOSCOPY  03/03/2021  ? [facility] at [facility]  ? index finger  Right   ? WISDOM TOOTH EXTRACTION    ? x4 removed  ? ?Social History  ? ?Occupational History  ? Occupation: midwife  ?Tobacco Use  ? Smoking status: Never  ? Smokeless tobacco: Never  ?Vaping Use  ?  Vaping Use: Never used  ?Substance and Sexual Activity  ? Alcohol use: Yes  ?  Comment: rarely  ? Drug use: Never  ? Sexual activity: Yes  ?  Partners: Male  ?  Birth control/protection: I.U.D.  ?  Comment: Husband has absence of Vas Deferens  ? ? ? ? ? ? ?

## 2021-08-10 ENCOUNTER — Encounter: Payer: 59 | Admitting: Orthopedic Surgery

## 2021-08-11 ENCOUNTER — Ambulatory Visit: Payer: 59 | Admitting: Rehabilitative and Restorative Service Providers"

## 2021-08-11 DIAGNOSIS — M25642 Stiffness of left hand, not elsewhere classified: Secondary | ICD-10-CM | POA: Diagnosis not present

## 2021-08-11 DIAGNOSIS — M79642 Pain in left hand: Secondary | ICD-10-CM | POA: Diagnosis not present

## 2021-08-11 DIAGNOSIS — M6281 Muscle weakness (generalized): Secondary | ICD-10-CM

## 2021-08-11 NOTE — Therapy (Signed)
?OUTPATIENT OCCUPATIONAL THERAPY TREATMENT NOTE ? ? ?Patient Name: Pamela Williamson ?MRN: [id number] ?DOB:1976-05-03, 46 y.o., female ?Today's Date: 08/11/2021 ? ?PCP: Cyril Loosen, NP ?REFERRING PROVIDER: Sherilyn Cooter, MD ? ? OT End of Session - 08/11/21 5573   ? ? Visit Number 2   ? Number of Visits 12   ? Date for OT Re-Evaluation 09/18/21   ? OT Start Time 0930   ? OT Stop Time 1012   ? OT Time Calculation (min) 42 min   ? Equipment Utilized During Treatment --   ? Activity Tolerance Patient tolerated treatment well;No increased pain   ? Behavior During Therapy [facility] for tasks assessed/performed   ? ?  ?  ? ?  ? ? ?Past Medical History:  ?Diagnosis Date  ? Allergy   ? Depression   ? GERD (gastroesophageal reflux disease)   ? IBS (irritable bowel syndrome)   ? ?Past Surgical History:  ?Procedure Laterality Date  ? AUGMENTATION MAMMAPLASTY Bilateral   ? Breast augmentation with breast lift  ? CLOSED REDUCTION FINGER WITH PERCUTANEOUS PINNING Left 07/27/2021  ? Procedure: LEFT SMALL FINGER CLOSED REDUCTION FINGER WITH PERCUTANEOUS PINNING;  Surgeon: Sherilyn Cooter, MD;  Location: [place];  Service: Orthopedics;  Laterality: Left;  ? COLONOSCOPY  03/03/2021  ? [facility] at [facility]  ? index finger  Right   ? WISDOM TOOTH EXTRACTION    ? x4 removed  ? ?Patient Active Problem List  ? Diagnosis Date Noted  ? Closed displaced fracture of middle phalanx of left little finger 07/23/2021  ? Nondisplaced fracture of proximal phalanx of left ring finger, initial encounter for closed fracture 07/23/2021  ? IUD (intrauterine device) in place 11/07/2018  ? Abnormal uterine bleeding (AUB) 11/07/2018  ? ? ?ONSET DATE: 07/27/21 DOS ?  ?REFERRING DIAG: U20.254Y (ICD-10-CM) - Closed displaced fracture of middle phalanx of left little finger, initial encounter ? ?THERAPY DIAG:  ?Stiffness of left hand, not elsewhere classified ? ?Muscle weakness (generalized) ? ?Pain in left hand ? ? ?PERTINENT HISTORY: Now 2 weeks  post-op. Per MD: "Left small finger P2 fracture s/p CRPP.  Left ring finger P1 fracture being treated nonoperatively.  Therapy to work on edema control and prevent postoperative stiffness. Possible splint for left small finger to immobilize DIPJ." ? ?PRECAUTIONS: NWB in Lt hand for now, no AROM until 3-4 weeks (once healed), A/AAROM after 5-6 weeks (pin out), bracing until 8-10 weeks  ? ?SUBJECTIVE: She states not needing to wrap RF now, but wearing orthotic, needs a bit of adjustment as it's bumping palm.  ? ?PAIN:  ?Are you having pain? No ?Rating:   0/10 ? ? ?OBJECTIVE: (All objective assessments below are from initial evaluation on: 07/27/21 unless otherwise specified.)  ? ?SUBJECTIVE STATEMENT: ?She states she is a bit "woozy" from looking at pins, she has a worried look on her face at entrance. 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 She states walking her dog and the leash pulled, breaking her hand and she fell.  ?  ?PERTINENT HISTORY: Now 1.5 weeks post-op. Per MD: "Left small finger P2 fracture s/p CRPP.  Left ring finger P1 fracture being treated nonoperatively.  Therapy to work on edema control and prevent postoperative stiffness. Possible splint for left small finger to immobilize DIPJ." ?  ?PRECAUTIONS: NWB in Lt hand for now, no AROM until 3-4 weeks (once healed), A/AAROM after 5-6 weeks (pin out), bracing until 8-10 weeks  ?  ?PAIN:  ?Are you having pain?  No pain at rest, up to 6/10 at worst in past week, throbbing, numb at times.  ?  ?FALLS: Has patient fallen in last 6 months? Yes. Number of falls 1 (this accident)  ?  ?LIVING ENVIRONMENT: ?Lives with: lives with their family ?  ?PLOF: Independent ?  ?PATIENT GOALS To get hand bette rto return back to work ?  ?OBJECTIVE:  ?  ?HAND DOMINANCE: Right ?  ?ADLs: ?Overall ADLs: difficulty and problems opening containers, lifting/carry objects, doing work tasks like typing, delivering babies, etc.  ?  ?  ?FUNCTIONAL OUTCOME MEASURES: ?Quick Dash: 43%  impairment today ?  ?UE ROM   Fingers very straight today at rest/passively (no lags), but motion not indicated for IPJs yet.at [facility]. Detailed measures may be taken next session. Non-involved fingers look great, minimal swelling, seem fairly flexible.  ?  ?Active ROM Left ?08/06/2021  ?Wrist flexion 69  ?Wrist extension 69  ?(Blank rows = not tested) ?  ?Active ROM Left ?08/06/2021  ?Ring MCP (0-90) 90   ?Ring PIP (0-100)    ?Ring DIP (0-70)    ?Little MCP (0-90) 90   ?Little PIP (0-100)    ?Little DIP (0-70)    ?(Blank rows = not tested) ?  ?  ?UE MMT:   NT at eval ?  ?  ?HAND FUNCTION: ?Grip strength: Right: 77# lbs; Left: TBD lbs ?  ?COORDINATION: ?Overtly limited at eval, cannot oppose to SF, cannot make full fist. ?  ?SENSATION: ?Pt c/o tingling/numbness intermittently, this will be further addressed if no resolution when swelling decreases ?  ?EDEMA: minimal in RF and SF today, other fingers not very effected  ?  ?COGNITION: ?Overall cognitive status: Within functional limits for tasks assessed though very apparently anxious ?  ?OBSERVATIONS: Much less tender and anxious, noticeable SF ulnar angulation at DIP today. Less swollen ?  ?  ?TODAY'S TREATMENT:  ?08/11/21:  OT adjusts orthotic, adding strap to decrease ulnar angulation at DIP J (doesn't bother pin site for now). Also trims proximal base to prevent "bumping" at MCP Js.  She states fitting well now. Reviewed HEP for whole arm motion, added wrist stretches, thumb flex stretches (due to c/o thumb tightness/overuse), reviewed AROM at RF with SF held straight. Self-care swelling management is reviewed and desensitization is also discussed for self-care, to touch/rub 3-4 x day for 2 mins to help "itching" feeling and prevent further pains, etc. Pin sites very clean.  ? ?Exercises ?- Seated Wrist Flexion Stretch  - 3-6 x daily - 3-5 reps - 15 hold ?- Seated Wrist Extension Stretch  - 3-6 x daily - 3-5 reps - 15 hold ?- Thumb flexion stretches, 3x 15sec  holds ? ?08/06/21 Eval: Custom orthotic fabrication was indicated due to pt's healing L SF P2 & RF P1 fxs and need for safe, functional positioning. OT fabricated custom hand-based PIP, DIP ext orthotic for pt today to allow healing and prevent PIP J flexion contractures. It fit well with no areas of pressure, pt states a comfortable fit. Pt was educated on the wearing schedule, to call or come in ASAP if it is causing any irritation or is not achieving desired function. Care was taken to pad and protect pin sites and prevent direct pressure to pins with strapping. It will be checked/adjusted in upcoming sessions, as needed. Pt states understanding.  ?  ?She is also given first HEP education to perform AROM at non-involved joints (shoulder, elbow, wrist, hand) without pain, etc. She states understanding.  She is overtly stressed, tearful at times, wishes to be full duty for work and states feeling strangely when looking at pins.  OT emphasizes that we need to avoid flexion contracture at PIP J, but allows her to remove orthotic 4x day, hold SF straight with right hand and slowly work on RF AROM in flex and ext. She states understanding. She is also edu on self-care wrapping fingers to help with edema, padding pin sites, caution with routines for not hurting sites, keeping clean/dry, etc. She is not very happy, but states understanding. She was edu on not lifting more than 3# with left hand for now.  ?  ?  ?PATIENT EDUCATION: ?Education details: see tx section above for details  ?Person educated: Patient ?Education method: Explanation and Demonstration ?Education comprehension: verbalized understanding, returned demonstration, and needs further education ?  ?  ?HOME EXERCISE PROGRAM: ?Access Code: [email or web address] ?URL: [email or web address] ?Prepared by: Benito Mccreedy ?  ?GOALS: ?Goals reviewed with patient? Yes ?  ?SHORT TERM GOALS: (STG required if POC>30 days) ?  ?Pt will obtain protective, custom  orthotic. ?Target date: 08/06/21 ?Goal status: MET ?  ?2.  Pt will demo/state understanding of initial HEP to improve pain levels and prerequisite motion. ?Target date: 08/21/21 ?Goal status: INITIAL ?  ?  ?LONG TERM GOALS: ?

## 2021-08-20 ENCOUNTER — Encounter: Payer: Self-pay | Admitting: Rehabilitative and Restorative Service Providers"

## 2021-08-20 ENCOUNTER — Ambulatory Visit (INDEPENDENT_AMBULATORY_CARE_PROVIDER_SITE_OTHER): Payer: 59

## 2021-08-20 ENCOUNTER — Ambulatory Visit (INDEPENDENT_AMBULATORY_CARE_PROVIDER_SITE_OTHER): Payer: 59 | Admitting: Orthopedic Surgery

## 2021-08-20 ENCOUNTER — Encounter: Payer: Self-pay | Admitting: Orthopedic Surgery

## 2021-08-20 ENCOUNTER — Ambulatory Visit (INDEPENDENT_AMBULATORY_CARE_PROVIDER_SITE_OTHER): Payer: 59 | Admitting: Rehabilitative and Restorative Service Providers"

## 2021-08-20 DIAGNOSIS — S62645A Nondisplaced fracture of proximal phalanx of left ring finger, initial encounter for closed fracture: Secondary | ICD-10-CM

## 2021-08-20 DIAGNOSIS — Z9889 Other specified postprocedural states: Secondary | ICD-10-CM

## 2021-08-20 DIAGNOSIS — M79642 Pain in left hand: Secondary | ICD-10-CM | POA: Diagnosis not present

## 2021-08-20 DIAGNOSIS — M6281 Muscle weakness (generalized): Secondary | ICD-10-CM | POA: Diagnosis not present

## 2021-08-20 DIAGNOSIS — M25642 Stiffness of left hand, not elsewhere classified: Secondary | ICD-10-CM

## 2021-08-20 DIAGNOSIS — S62627A Displaced fracture of medial phalanx of left little finger, initial encounter for closed fracture: Secondary | ICD-10-CM

## 2021-08-20 NOTE — Progress Notes (Signed)
? ?Post-Op Visit Note ?  ?Patient: Pamela Williamson           ?Date of Birth: [birth year]           ?MRN: [id number] ?Visit Date: 08/20/2021 ?PCP: Cyril Loosen, NP ? ? ?Assessment & Plan: ? ?Chief Complaint:  ?Chief Complaint  ?Patient presents with  ? Left Hand - Routine Post Op  ? ?Visit Diagnoses:  ?1. Post-operative state   ?2. Closed displaced fracture of middle phalanx of left little finger, initial encounter   ?3. Nondisplaced fracture of proximal phalanx of left ring finger, initial encounter for closed fracture   ? ? ?Plan: Patient is 3.5 weeks s/p CRPP of her left small finger middle phalanx fracture.  She is doing well postoperatively.  Her pain is much improved.  She is been working with hand therapy.  She still some stiffness at the small finger PIP joint.  She does have some mild apparent clinical malrotation of the finger when the fingers extended but it does not seem to be an issue when she tends to make a fist.  I will see her next week while she is in hand therapy and will likely pull the K wires. ? ?Follow-Up Instructions: No follow-ups on file.  ? ?Orders:  ?Orders Placed This Encounter  ?Procedures  ? XR Finger Little Left  ? ?No orders of the defined types were placed in this encounter. ? ? ?Imaging: ?No results found. ? ?PMFS History: ?Patient Active Problem List  ? Diagnosis Date Noted  ? Closed displaced fracture of middle phalanx of left little finger 07/23/2021  ? Nondisplaced fracture of proximal phalanx of left ring finger, initial encounter for closed fracture 07/23/2021  ? IUD (intrauterine device) in place 11/07/2018  ? Abnormal uterine bleeding (AUB) 11/07/2018  ? ?Past Medical History:  ?Diagnosis Date  ? Allergy   ? Depression   ? GERD (gastroesophageal reflux disease)   ? IBS (irritable bowel syndrome)   ?  ?Family History  ?Problem Relation Age of Onset  ? Hypertension Mother   ? Hyperlipidemia Mother   ? Diabetes Mother   ? Obesity Mother   ? Colon polyps Father   ?  Parkinson's disease Father   ? Hypothyroidism Sister   ? Arthritis/Rheumatoid Sister   ? Hypertension Sister   ? Breast cancer Maternal Grandmother   ? Colon cancer Neg Hx   ? Esophageal cancer Neg Hx   ? Rectal cancer Neg Hx   ? Stomach cancer Neg Hx   ?  ?Past Surgical History:  ?Procedure Laterality Date  ? AUGMENTATION MAMMAPLASTY Bilateral   ? Breast augmentation with breast lift  ? CLOSED REDUCTION FINGER WITH PERCUTANEOUS PINNING Left 07/27/2021  ? Procedure: LEFT SMALL FINGER CLOSED REDUCTION FINGER WITH PERCUTANEOUS PINNING;  Surgeon: Sherilyn Cooter, MD;  Location: [place];  Service: Orthopedics;  Laterality: Left;  ? COLONOSCOPY  03/03/2021  ? [facility] at [facility]  ? index finger  Right   ? WISDOM TOOTH EXTRACTION    ? x4 removed  ? ?Social History  ? ?Occupational History  ? Occupation: midwife  ?Tobacco Use  ? Smoking status: Never  ? Smokeless tobacco: Never  ?Vaping Use  ? Vaping Use: Never used  ?Substance and Sexual Activity  ? Alcohol use: Yes  ?  Comment: rarely  ? Drug use: Never  ? Sexual activity: Yes  ?  Partners: Male  ?  Birth control/protection: I.U.D.  ?  Comment: Husband has absence of  [place]  ? ? ? ?

## 2021-08-20 NOTE — Therapy (Signed)
?OUTPATIENT OCCUPATIONAL THERAPY TREATMENT NOTE ? ? ?Patient Name: Pamela Williamson ?MRN: [id number] ?DOB:Jan 10, 1976, 46 y.o., female ?Today's Date: 08/20/2021 ? ?PCP: Cyril Loosen, NP ?REFERRING PROVIDER: Sherilyn Cooter, MD ? ? ? ? ?Past Medical History:  ?Diagnosis Date  ? Allergy   ? Depression   ? GERD (gastroesophageal reflux disease)   ? IBS (irritable bowel syndrome)   ? ?Past Surgical History:  ?Procedure Laterality Date  ? AUGMENTATION MAMMAPLASTY Bilateral   ? Breast augmentation with breast lift  ? CLOSED REDUCTION FINGER WITH PERCUTANEOUS PINNING Left 07/27/2021  ? Procedure: LEFT SMALL FINGER CLOSED REDUCTION FINGER WITH PERCUTANEOUS PINNING;  Surgeon: Sherilyn Cooter, MD;  Location: [place];  Service: Orthopedics;  Laterality: Left;  ? COLONOSCOPY  03/03/2021  ? [facility] at [facility]  ? index finger  Right   ? WISDOM TOOTH EXTRACTION    ? x4 removed  ? ?Patient Active Problem List  ? Diagnosis Date Noted  ? Closed displaced fracture of middle phalanx of left little finger 07/23/2021  ? Nondisplaced fracture of proximal phalanx of left ring finger, initial encounter for closed fracture 07/23/2021  ? IUD (intrauterine device) in place 11/07/2018  ? Abnormal uterine bleeding (AUB) 11/07/2018  ? ? ?ONSET DATE: 07/27/21 DOS ?  ?REFERRING DIAG: B15.176H (ICD-10-CM) - Closed displaced fracture of middle phalanx of left little finger, initial encounter ? ?THERAPY DIAG:  ?Muscle weakness (generalized) ? ?Pain in left hand ? ?Stiffness of left hand, not elsewhere classified ? ? ?PERTINENT HISTORY: Now 3+ weeks post-op. Per MD: "Left small finger P2 fracture s/p CRPP.  Left ring finger P1 fracture being treated nonoperatively.  Therapy to work on edema control and prevent postoperative stiffness. Possible splint for left small finger to immobilize DIPJ." ? ?PRECAUTIONS: NWB in Lt hand for now, no AROM until 3-4 weeks (once healed), A/AAROM after 5-6 weeks (pin out), bracing until 8-10 weeks   ? ?SUBJECTIVE:  ?She states officially starting back to work and doing well, still avoiding use of Rt hand. MD f/u today went well with interval healing shown.  ? ?PAIN:  ?Are you having pain? Not now at rest ?Rating:   0/10 ? ? ?OBJECTIVE: (All objective assessments below are from initial evaluation on: 07/27/21 unless otherwise specified.)  ? ?  ?ADLs: ?Overall ADLs: difficulty and problems opening containers, lifting/carry objects, doing work tasks like typing, delivering babies, etc.  ?  ?  ?FUNCTIONAL OUTCOME MEASURES: ?Quick Dash: 43% impairment today ?  ?UE ROM   Fingers very straight today at rest/passively (no lags), but motion not indicated for IPJs yet.at [facility]. Detailed measures may be taken next session. Non-involved fingers look great, minimal swelling, seem fairly flexible.  ?  ?Active ROM Left ?08/06/2021  ?Wrist flexion 69  ?Wrist extension 69  ?(Blank rows = not tested) ?  ?Active ROM Left ?08/06/2021  ?Ring MCP (0-90) 90   ?Ring PIP (0-100)    ?Ring DIP (0-70)    ?Little MCP (0-90) 90   ?Little PIP (0-100)    ?Little DIP (0-70)    ?(Blank rows = not tested) ?  ?  ?UE MMT:   NT at eval ?  ?  ?HAND FUNCTION: ?Grip strength: Right: 77# lbs; Left: TBD lbs ?  ?COORDINATION: ?Overtly limited at eval, cannot oppose to SF, cannot make full fist. ?  ?SENSATION: ?Pt c/o tingling/numbness intermittently, this will be further addressed if no resolution when swelling decreases ?  ?EDEMA: minimal in RF and SF today, other fingers not  very effected  ?  ?COGNITION: ?Overall cognitive status: Within functional limits for tasks assessed though very apparently anxious ?  ?OBSERVATIONS: Much less tender and anxious, noticeable SF ulnar angulation at DIP today. Less swollen ?  ?  ?TODAY'S TREATMENT:  ?08/20/21: Due to 3 weeks out and healing shown on x-rays, OT edu/added AROM at [facility] PIP J today with blocking in flexion and ext, also abd/adduction AROM, and light DIP J stretches in RF with PIP J and MCP J stabilized only. She  tolerates all well and orthotic is slightly adjusted. She is also given hand-written plan for moving forward, weaning schedule and schedule and education for when/how to begin light ADLs and hand strength. She states understanding, has no pain at end.  ? ?08/11/21:  OT adjusts orthotic, adding strap to decrease ulnar angulation at DIP J (doesn't bother pin site for now). Also trims proximal base to prevent "bumping" at MCP Js.  She states fitting well now. Reviewed HEP for whole arm motion, added wrist stretches, thumb flex stretches (due to c/o thumb tightness/overuse), reviewed AROM at RF with SF held straight. Self-care swelling management is reviewed and desensitization is also discussed for self-care, to touch/rub 3-4 x day for 2 mins to help "itching" feeling and prevent further pains, etc. Pin sites very clean.  ? ?Exercises ?- Seated Wrist Flexion Stretch  - 3-6 x daily - 3-5 reps - 15 hold ?- Seated Wrist Extension Stretch  - 3-6 x daily - 3-5 reps - 15 hold ?- Thumb flexion stretches, 3x 15sec holds  ?  ?PATIENT EDUCATION: ?Education details: see tx section above for details  ?Person educated: Patient ?Education method: Explanation and Demonstration ?Education comprehension: verbalized understanding, returned demonstration, and needs further education ?  ?  ?HOME EXERCISE PROGRAM: ?Access Code: [email or web address] ?URL: [email or web address] ?Prepared by: Benito Mccreedy ?  ?GOALS: ?Goals reviewed with patient? Yes ?  ?SHORT TERM GOALS: (STG required if POC>30 days) ?  ?Pt will obtain protective, custom orthotic. ?Target date: 08/06/21 ?Goal status: MET ?  ?2.  Pt will demo/state understanding of initial HEP to improve pain levels and prerequisite motion. ?Target date: 08/21/21 ?Goal status: INITIAL ?  ?  ?LONG TERM GOALS: ?  ?Pt will improve functional ability by decreased impairment per Quick DASH assessment from 43% to 10% or better, for better quality of life. ?Target date: 09/25/21 ?Goal status:  INITIAL ?  ?2.  Pt will improve grip strength in left hand from to at least 40lbs for functional use at home and in IADLs. ?Target date: 09/25/21 ?Goal status: INITIAL ?  ?3.  Pt will improve A/ROM in Lt SF TAM to at least 200*, to have functional motion for tasks like and grasp and hold small objects in hand.  ?Target date: 09/25/21 ?Goal status: INITIAL ?  ?  ?ASSESSMENT: ?  ?CLINICAL IMPRESSION: ?08/20/21: healing and improving  ? ?08/11/21: improving swelling, motion, no tenderness now, doing well  ? ?08/06/21: Patient is a 46 y.o. female who was seen today for occupational therapy evaluation for Lt SF p2 fx and RF P1 fxs and decreased functional ability.  ?  ?  ?PLAN: ?OT FREQUENCY: 2x/week ?  ?OT DURATION: 8 weeks ?  ?PLANNED INTERVENTIONS: self care/ADL training, therapeutic exercise, therapeutic activity, neuromuscular re-education, manual therapy, scar mobilization, passive range of motion, splinting, electrical stimulation, ultrasound, fluidotherapy, compression bandaging, moist heat, cryotherapy, contrast bath, patient/family education, coping strategies training, and DME and/or AE instructions ?  ?RECOMMENDED OTHER SERVICES: none now ?  ?CONSULTED  AND AGREED WITH PLAN OF CARE: Patient ?  ?PLAN FOR NEXT SESSION:  ?Next week, k-wire removal, trial light fnl activities, review HEP and perform manually.  Consider skipping 1 week to allow for self-care and healing and definitely see 6 weeks post/op to initial light PROM in SF as tolerated/healing allows.  ? ? ?Benito Mccreedy, OTR/L, CHT ?08/20/2021, 3:19 PM ? ?  ? ?  ?

## 2021-08-25 ENCOUNTER — Encounter: Payer: Self-pay | Admitting: Rehabilitative and Restorative Service Providers"

## 2021-08-25 ENCOUNTER — Ambulatory Visit (INDEPENDENT_AMBULATORY_CARE_PROVIDER_SITE_OTHER): Payer: 59 | Admitting: Rehabilitative and Restorative Service Providers"

## 2021-08-25 DIAGNOSIS — M79642 Pain in left hand: Secondary | ICD-10-CM | POA: Diagnosis not present

## 2021-08-25 DIAGNOSIS — M6281 Muscle weakness (generalized): Secondary | ICD-10-CM

## 2021-08-25 DIAGNOSIS — M25642 Stiffness of left hand, not elsewhere classified: Secondary | ICD-10-CM

## 2021-08-25 NOTE — Therapy (Signed)
?OUTPATIENT OCCUPATIONAL THERAPY TREATMENT NOTE ? ? ?Patient Name: Pamela Williamson ?MRN: [id number] ?DOB:02/02/1976, 46 y.o., female ?Today's Date: 08/25/2021 ? ?PCP: Norman, Anna Collins, NP ?REFERRING PROVIDER: Benfield, Charlie, MD ? ? OT End of Session - 08/25/21 0850   ? ? Visit Number 4   ? Number of Visits 12   ? Date for OT Re-Evaluation 09/18/21   ? OT Start Time 0848   ? OT Stop Time 0934   ? OT Time Calculation (min) 46 min   ? Activity Tolerance Patient tolerated treatment well;No increased pain   ? Behavior During Therapy WFL for tasks assessed/performed   ? ?  ?  ? ?  ? ? ? ?Past Medical History:  ?Diagnosis Date  ? Allergy   ? Depression   ? GERD (gastroesophageal reflux disease)   ? IBS (irritable bowel syndrome)   ? ?Past Surgical History:  ?Procedure Laterality Date  ? AUGMENTATION MAMMAPLASTY Bilateral   ? Breast augmentation with breast lift  ? CLOSED REDUCTION FINGER WITH PERCUTANEOUS PINNING Left 07/27/2021  ? Procedure: LEFT SMALL FINGER CLOSED REDUCTION FINGER WITH PERCUTANEOUS PINNING;  Surgeon: Benfield, Charlie, MD;  Location: [facility];  Service: Orthopedics;  Laterality: Left;  ? COLONOSCOPY  03/03/2021  ? [facility]  ? index finger  Right   ? WISDOM TOOTH EXTRACTION    ? x4 removed  ? ?Patient Active Problem List  ? Diagnosis Date Noted  ? Closed displaced fracture of middle phalanx of left little finger 07/23/2021  ? Nondisplaced fracture of proximal phalanx of left ring finger, initial encounter for closed fracture 07/23/2021  ? IUD (intrauterine device) in place 11/07/2018  ? Abnormal uterine bleeding (AUB) 11/07/2018  ? ? ?ONSET DATE: 07/27/21 DOS ?  ?REFERRING DIAG: S62.627A (ICD-10-CM) - Closed displaced fracture of middle phalanx of left little finger, initial encounter ? ?THERAPY DIAG:  ?Stiffness of left hand, not elsewhere classified ? ?Pain in left hand ? ?Muscle weakness (generalized) ? ? ?PERTINENT HISTORY: Now 4 weeks post-op. Per MD: "Left small finger P2  fracture s/p CRPP.  Left ring finger P1 fracture being treated nonoperatively.  Therapy to work on edema control and prevent postoperative stiffness. Possible splint for left small finger to immobilize DIPJ." ? ?PRECAUTIONS: NWB in Lt hand for now, no AROM until 3-4 weeks (once healed), A/AAROM after 5-6 weeks (pin out), bracing until 8-10 weeks  ? ?SUBJECTIVE:  ?She states feeling better, thinking finger looks a bit less twisted. No pain now, has been doing HEP ? ?PAIN:  ?Are you having pain? Not now at rest ?Rating:   0/10 ? ? ?OBJECTIVE: (All objective assessments below are from initial evaluation on: 07/27/21 unless otherwise specified.)  ? ?  ?ADLs: ?Overall ADLs: difficulty and problems opening containers, lifting/carry objects, doing work tasks like typing, delivering babies, etc.  ?  ?  ?FUNCTIONAL OUTCOME MEASURES: ?Quick Dash: 43% impairment today ?  ?UE ROM   Fingers very straight today at rest/passively (no lags), but motion not indicated for IPJs yet.at SF. Detailed measures may be taken next session. Non-involved fingers look great, minimal swelling, seem fairly flexible.  ?  ?Active ROM Left ?08/06/2021  ?Wrist flexion 69  ?Wrist extension 69  ?(Blank rows = not tested) ?  ?Active ROM Left ?08/06/2021 Left ?08/25/21  ?Ring MCP (0-90) 90    ?Ring PIP (0-100)     ?Ring DIP (0-70)     ?Little MCP (0-90) 90    ?Little PIP (0-100)     ?  Little DIP (0-70)   0-5  ?(Blank rows = not tested) ?  ?  ?UE MMT:   NT at eval ?  ?  ?HAND FUNCTION: ?Grip strength: Right: 77# lbs; Left: TBD lbs ?  ?COORDINATION: ?Overtly limited at eval, cannot oppose to SF, cannot make full fist. ?  ?SENSATION: ?Pt c/o tingling/numbness intermittently, this will be further addressed if no resolution when swelling decreases ?  ?EDEMA: minimal in RF and SF today, other fingers not very effected  ?  ?COGNITION: ?Overall cognitive status: Within functional limits for tasks assessed though very apparently anxious ?  ?OBSERVATIONS: Much less  tender and anxious, noticeable SF ulnar angulation at DIP today. Less swollen ?  ?  ?TODAY'S TREATMENT:  ?08/25/21: MD check on pt during session and removes K-wires (this time (about 8 minutes) not billed for in therapy).  ?OT reviews HEP and updates to 4 weeks out now full RF and SF AROM in flex, ext, blocking at RF PIPJ and light stretches at RF DIP and blocking PIP J SF with flexion. Also given demo and instruction to begin light blocking at SF DIP J in flexion in 3-4 days as tolerated. Her RF PIP J ext looks much better now. She is also edu in and performs tending gliding dynamic activity, especially hook fist with left hand is is stiff but not painful today. OT emphasizes no pain, to rest and reduce frequency of exercises and activities if she develops any pain. OT also slightly adjusts orthotic to fit better now that finger is not wrapped.  ? ? ?08/20/21: Due to 3 weeks out and healing shown on x-rays, OT edu/added AROM at SF PIP J today with blocking in flexion and ext, also abd/adduction AROM, and light DIP J stretches in RF with PIP J and MCP J stabilized only. She tolerates all well and orthotic is slightly adjusted. She is also given hand-written plan for moving forward, weaning schedule and schedule and education for when/how to begin light ADLs and hand strength. She states understanding, has no pain at end.  ? ?08/11/21:  OT adjusts orthotic, adding strap to decrease ulnar angulation at DIP J (doesn't bother pin site for now). Also trims proximal base to prevent "bumping" at MCP Js.  She states fitting well now. Reviewed HEP for whole arm motion, added wrist stretches, thumb flex stretches (due to c/o thumb tightness/overuse), reviewed AROM at RF with SF held straight. Self-care swelling management is reviewed and desensitization is also discussed for self-care, to touch/rub 3-4 x day for 2 mins to help "itching" feeling and prevent further pains, etc. Pin sites very clean.  ? ?Exercises ?- Seated Wrist  Flexion Stretch  - 3-6 x daily - 3-5 reps - 15 hold ?- Seated Wrist Extension Stretch  - 3-6 x daily - 3-5 reps - 15 hold ?- Thumb flexion stretches, 3x 15sec holds  ?  ?PATIENT EDUCATION: ?Education details: see tx section above for details  ?Person educated: Patient ?Education method: Explanation and Demonstration ?Education comprehension: verbalized understanding, returned demonstration, and needs further education ?  ?  ?HOME EXERCISE PROGRAM: ?Access Code: [email or web address] ?URL: [email or web address] ?Prepared by: Nathanael Moore ?  ?GOALS: ?Goals reviewed with patient? Yes ?  ?SHORT TERM GOALS: (STG required if POC>30 days) ?  ?Pt will obtain protective, custom orthotic. ?Target date: 08/06/21 ?Goal status: MET ?  ?2.  Pt will demo/state understanding of initial HEP to improve pain levels and prerequisite motion. ?Target date: 08/21/21 ?Goal status: INITIAL ?  ?  ?  LONG TERM GOALS: ?  ?Pt will improve functional ability by decreased impairment per Quick DASH assessment from 43% to 10% or better, for better quality of life. ?Target date: 09/25/21 ?Goal status: INITIAL ?  ?2.  Pt will improve grip strength in left hand from to at least 40lbs for functional use at home and in IADLs. ?Target date: 09/25/21 ?Goal status: INITIAL ?  ?3.  Pt will improve A/ROM in Lt SF TAM to at least 200*, to have functional motion for tasks like and grasp and hold small objects in hand.  ?Target date: 09/25/21 ?Goal status: INITIAL ?  ?  ?ASSESSMENT: ?  ?CLINICAL IMPRESSION: ?08/25/21: Pt now with k-wires out and starting full hand & finger AROM as tolerated  ? ?08/20/21: healing and improving  ? ?08/11/21: improving swelling, motion, no tenderness now, doing well  ? ?08/06/21: Patient is a 46 y.o. female who was seen today for occupational therapy evaluation for Lt SF p2 fx and RF P1 fxs and decreased functional ability.  ?  ?  ?PLAN: ?OT FREQUENCY: 2x/week ?  ?OT DURATION: 8 weeks ?  ?PLANNED INTERVENTIONS: self care/ADL training,  therapeutic exercise, therapeutic activity, neuromuscular re-education, manual therapy, scar mobilization, passive range of motion, splinting, electrical stimulation, ultrasound, fluidotherapy, compression b

## 2021-09-04 ENCOUNTER — Ambulatory Visit (INDEPENDENT_AMBULATORY_CARE_PROVIDER_SITE_OTHER): Payer: 59 | Admitting: Rehabilitative and Restorative Service Providers"

## 2021-09-04 ENCOUNTER — Encounter: Payer: 59 | Admitting: Rehabilitative and Restorative Service Providers"

## 2021-09-04 ENCOUNTER — Ambulatory Visit (INDEPENDENT_AMBULATORY_CARE_PROVIDER_SITE_OTHER): Payer: 59 | Admitting: Orthopedic Surgery

## 2021-09-04 ENCOUNTER — Encounter: Payer: Self-pay | Admitting: Rehabilitative and Restorative Service Providers"

## 2021-09-04 DIAGNOSIS — M79642 Pain in left hand: Secondary | ICD-10-CM

## 2021-09-04 DIAGNOSIS — S62627A Displaced fracture of medial phalanx of left little finger, initial encounter for closed fracture: Secondary | ICD-10-CM

## 2021-09-04 DIAGNOSIS — M6281 Muscle weakness (generalized): Secondary | ICD-10-CM

## 2021-09-04 DIAGNOSIS — S62645A Nondisplaced fracture of proximal phalanx of left ring finger, initial encounter for closed fracture: Secondary | ICD-10-CM

## 2021-09-04 DIAGNOSIS — M25642 Stiffness of left hand, not elsewhere classified: Secondary | ICD-10-CM | POA: Diagnosis not present

## 2021-09-04 NOTE — Therapy (Signed)
?OUTPATIENT OCCUPATIONAL THERAPY TREATMENT NOTE ? ? ?Patient Name: Pamela Williamson ?MRN: [id number] ?DOB:02-28-1976, 46 y.o., female ?Today's Date: 09/04/2021 ? ?PCP: Cyril Loosen, NP ?REFERRING PROVIDER: Sherilyn Cooter, MD ? ? OT End of Session - 09/04/21 1019   ? ? Visit Number 5   ? Number of Visits 12   ? Date for OT Re-Evaluation 09/18/21   ? OT Start Time 1019   ? OT Stop Time 1103   ? OT Time Calculation (min) 44 min   ? Activity Tolerance Patient tolerated treatment well;No increased pain   ? Behavior During Therapy [facility] for tasks assessed/performed   ? ?  ?  ? ?  ? ? ? ? ?Past Medical History:  ?Diagnosis Date  ? Allergy   ? Depression   ? GERD (gastroesophageal reflux disease)   ? IBS (irritable bowel syndrome)   ? ?Past Surgical History:  ?Procedure Laterality Date  ? AUGMENTATION MAMMAPLASTY Bilateral   ? Breast augmentation with breast lift  ? CLOSED REDUCTION FINGER WITH PERCUTANEOUS PINNING Left 07/27/2021  ? Procedure: LEFT SMALL FINGER CLOSED REDUCTION FINGER WITH PERCUTANEOUS PINNING;  Surgeon: Sherilyn Cooter, MD;  Location: [place];  Service: Orthopedics;  Laterality: Left;  ? COLONOSCOPY  03/03/2021  ? [facility] at [facility]  ? index finger  Right   ? WISDOM TOOTH EXTRACTION    ? x4 removed  ? ?Patient Active Problem List  ? Diagnosis Date Noted  ? Closed displaced fracture of middle phalanx of left little finger 07/23/2021  ? Nondisplaced fracture of proximal phalanx of left ring finger, initial encounter for closed fracture 07/23/2021  ? IUD (intrauterine device) in place 11/07/2018  ? Abnormal uterine bleeding (AUB) 11/07/2018  ? ? ?ONSET DATE: 07/27/21 DOS ?  ?REFERRING DIAG: V69.450T (ICD-10-CM) - Closed displaced fracture of middle phalanx of left little finger, initial encounter ? ?THERAPY DIAG:  ?Stiffness of left hand, not elsewhere classified ? ?Pain in left hand ? ?Muscle weakness (generalized) ? ? ?PERTINENT HISTORY: Now 5+ weeks post-op. Per MD: "Left small finger  P2 fracture s/p CRPP.  Left ring finger P1 fracture being treated nonoperatively.  Therapy to work on edema control and prevent postoperative stiffness. Possible splint for left small finger to immobilize DIPJ." ? ?PRECAUTIONS: NWB in Lt hand for now, no AROM until 3-4 weeks (once healed), A/AAROM after 5-6 weeks (pin out), bracing until 8-10 weeks  ? ?SUBJECTIVE:  ?She states upgrading to light stretches as told at 5 weeks, which was this Monday.  ? ?She states feeling better, thinking finger looks a bit less twisted. No pain now, has been doing HEP ? ?PAIN:  ?Are you having pain?  Not now at rest, but she states getting random shooting pains that subside fast (10 seconds)  ?Rating:   0/10 ? ? ?OBJECTIVE: (All objective assessments below are from initial evaluation on: 07/27/21 unless otherwise specified.)  ? ?  ?ADLs: ?Overall ADLs: difficulty and problems opening containers, lifting/carry objects, doing work tasks like typing, delivering babies, etc.  ?  ?  ?FUNCTIONAL OUTCOME MEASURES: ?Quick Dash: 43% impairment today ?  ?UE ROM   Fingers very straight today at rest/passively (no lags), but motion not indicated for IPJs yet.at [facility]. Detailed measures may be taken next session. Non-involved fingers look great, minimal swelling, seem fairly flexible.  ?  ?Active ROM Left ?08/06/2021 Left  ?09/04/21  ?Wrist flexion 69 75  ?Wrist extension 69 80  ?(Blank rows = not tested) ?  ?Active ROM Left ?  08/06/2021 Left ?08/25/21 Left  ?09/04/21  ?Ring MCP (0-90) 90   0- 90  ?Ring PIP (0-100)    0- 64  ?Ring DIP (0-70)    0- 7  ?Little MCP (0-90) 90   0- 90  ?Little PIP (0-100)    0- 40  ?Little DIP (0-70)   0-5 0- 22  ?(Blank rows = not tested) ?  ?  ?UE MMT:   NT at eval ?  ?  ?HAND FUNCTION: ?Grip strength: Right: 77# lbs; Left: TBD lbs ?  ?COORDINATION: ?Overtly limited at eval, cannot oppose to [facility], cannot make full fist. ? ?09/04/21: Box and Blocks left: 63 WFL (78 is mean)  ?  ?SENSATION: ?Pt c/o tingling/numbness  intermittently, this will be further addressed if no resolution when swelling decreases ?  ?EDEMA: minimal in RF and SF today, other fingers not very effected  ?  ?COGNITION: ?Overall cognitive status: Within functional limits for tasks assessed though very apparently anxious ?  ?OBSERVATIONS: Much less tender and anxious, noticeable SF ulnar angulation at DIP today. Less swollen ?  ?  ?TODAY'S TREATMENT:  ?09/04/21: While on MH 2-3 mins (no skin irritation), OT reviews HEP for blocking AROM and light PROM at uninvolved joints. She states she's been doing well with no pain. OT then has her complete multiple fnl activities (including box and blocks, in-hand manipulations, etc.) for FMS and light fnl use of hand. She also upgrades to light PROM/stretches at involved joints as tolerated. OT does this manually for her today with education for self-strech. No pain or problems today, she is edu to wait until Monday if having pain or swelling.  ? ?Exercises ?- Seated Wrist Flexion Stretch  - 3-6 x daily - 3-5 reps - 15 hold ?- Seated Wrist Extension Stretch  - 3-6 x daily - 3-5 reps - 15 hold ?- Tendon Glides  - 3-4 x daily - 5- 10 reps - 2-3 seconds hold ?- Hand AROM DIP Blocking  - 4-6 x daily - 10 reps - 2 seconds hold ?- Hand AROM PIP Blocking  - 4-6 x daily - 1 sets - 10-15 reps ?- Hand AROM Reverse Blocking  - 4-6 x daily - 1 sets - 10-15 reps ?- Seated Single Digit Intrinsic Stretch  - 4-6 x daily - 3-5 reps - 15-20 sec hold ? ?08/25/21: MD check on pt during session and removes K-wires (this time (about 8 minutes) not billed for in therapy).  ?OT reviews HEP and updates to 4 weeks out now full RF and SF AROM in flex, ext, blocking at RF PIPJ and light stretches at RF DIP and blocking PIP J SF with flexion. Also given demo and instruction to begin light blocking at [facility] DIP J in flexion in 3-4 days as tolerated. Her RF PIP J ext looks much better now. She is also edu in and performs tending gliding dynamic activity,  especially hook fist with left hand is is stiff but not painful today. OT emphasizes no pain, to rest and reduce frequency of exercises and activities if she develops any pain. OT also slightly adjusts orthotic to fit better now that finger is not wrapped.  ? ?  ?PATIENT EDUCATION: ?Education details: see tx section above for details  ?Person educated: Patient ?Education method: Explanation and Demonstration ?Education comprehension: verbalized understanding, returned demonstration, and needs further education ?  ?  ?HOME EXERCISE PROGRAM: ?Access Code: [email or web address] ?URL: [email or web address] ?Prepared by: Benito Mccreedy ?  ?GOALS: ?Goals reviewed with  patient? Yes ?  ?SHORT TERM GOALS: (STG required if POC>30 days) ?  ?Pt will obtain protective, custom orthotic. ?Target date: 08/06/21 ?Goal status: MET ?  ?2.  Pt will demo/state understanding of initial HEP to improve pain levels and prerequisite motion. ?Target date: 08/21/21 ?Goal status: INITIAL ?  ?  ?LONG TERM GOALS: ?  ?Pt will improve functional ability by decreased impairment per Quick DASH assessment from 43% to 10% or better, for better quality of life. ?Target date: 09/25/21 ?Goal status: INITIAL ?  ?2.  Pt will improve grip strength in left hand from to at least 40lbs for functional use at home and in IADLs. ?Target date: 09/25/21 ?Goal status: INITIAL ?  ?3.  Pt will improve A/ROM in Lt SF TAM to at least 200*, to have functional motion for tasks like and grasp and hold small objects in hand.  ?Target date: 09/25/21 ?Goal status: INITIAL ?  ?  ?ASSESSMENT: ?  ?CLINICAL IMPRESSION: ?09/04/21: Doing very well looks to have no significant angulation, more stiff in ext than flexion still, doing well with upgrades. ? ?08/25/21: Pt now with k-wires out and starting full hand & finger AROM as tolerated  ? ?08/20/21: healing and improving  ? ?08/11/21: improving swelling, motion, no tenderness now, doing well  ? ?08/06/21: Patient is a 46 y.o. female who was  seen today for occupational therapy evaluation for Lt SF p2 fx and RF P1 fxs and decreased functional ability.  ?  ?  ?PLAN: ?OT FREQUENCY: 2x/week ?  ?OT DURATION: 8 weeks ?  ?PLANNED INTERVENTIONS: self care/ADL tra

## 2021-09-04 NOTE — Progress Notes (Signed)
? ?  Post-Op Visit Note ?  ?Patient: Pamela Williamson           ?Date of Birth: Apr 30, 1976           ?MRN: [id number] ?Visit Date: 09/04/2021 ?PCP: Cyril Loosen, NP ? ? ?Assessment & Plan: ? ?Chief Complaint:  ?Chief Complaint  ?Patient presents with  ? Left Hand - Follow-up  ? ?Visit Diagnoses:  ?1. Closed displaced fracture of middle phalanx of left little finger, initial encounter   ?2. Nondisplaced fracture of proximal phalanx of left ring finger, initial encounter for closed fracture   ? ? ?Plan: Patient in 6 weeks s/p CRPP of right small finger P2 fracture and conservative management of nondisplaced left ring finger P1 fracture.  She is doing well postoperatively.  She is working hard in therapy on edema control and improving ROM.  I can see her back in another 2-4 weeks depending on her progress with therapy.  ? ?Follow-Up Instructions: No follow-ups on file.  ? ?Orders:  ?No orders of the defined types were placed in this encounter. ? ?No orders of the defined types were placed in this encounter. ? ? ?Imaging: ?No results found. ? ?PMFS History: ?Patient Active Problem List  ? Diagnosis Date Noted  ? Closed displaced fracture of middle phalanx of left little finger 07/23/2021  ? Nondisplaced fracture of proximal phalanx of left ring finger, initial encounter for closed fracture 07/23/2021  ? IUD (intrauterine device) in place 11/07/2018  ? Abnormal uterine bleeding (AUB) 11/07/2018  ? ?Past Medical History:  ?Diagnosis Date  ? Allergy   ? Depression   ? GERD (gastroesophageal reflux disease)   ? IBS (irritable bowel syndrome)   ?  ?Family History  ?Problem Relation Age of Onset  ? Hypertension Mother   ? Hyperlipidemia Mother   ? Diabetes Mother   ? Obesity Mother   ? Colon polyps Father   ? Parkinson's disease Father   ? Hypothyroidism Sister   ? Arthritis/Rheumatoid Sister   ? Hypertension Sister   ? Breast cancer Maternal Grandmother   ? Colon cancer Neg Hx   ? Esophageal cancer Neg Hx   ? Rectal  cancer Neg Hx   ? Stomach cancer Neg Hx   ?  ?Past Surgical History:  ?Procedure Laterality Date  ? AUGMENTATION MAMMAPLASTY Bilateral   ? Breast augmentation with breast lift  ? CLOSED REDUCTION FINGER WITH PERCUTANEOUS PINNING Left 07/27/2021  ? Procedure: LEFT SMALL FINGER CLOSED REDUCTION FINGER WITH PERCUTANEOUS PINNING;  Surgeon: Sherilyn Cooter, MD;  Location: [place];  Service: Orthopedics;  Laterality: Left;  ? COLONOSCOPY  03/03/2021  ? [facility] at [facility]  ? index finger  Right   ? WISDOM TOOTH EXTRACTION    ? x4 removed  ? ?Social History  ? ?Occupational History  ? Occupation: midwife  ?Tobacco Use  ? Smoking status: Never  ? Smokeless tobacco: Never  ?Vaping Use  ? Vaping Use: Never used  ?Substance and Sexual Activity  ? Alcohol use: Yes  ?  Comment: rarely  ? Drug use: Never  ? Sexual activity: Yes  ?  Partners: Male  ?  Birth control/protection: I.U.D.  ?  Comment: Husband has absence of Vas Deferens  ? ? ? ?

## 2021-09-08 ENCOUNTER — Encounter: Payer: Self-pay | Admitting: Rehabilitative and Restorative Service Providers"

## 2021-09-08 ENCOUNTER — Ambulatory Visit (INDEPENDENT_AMBULATORY_CARE_PROVIDER_SITE_OTHER): Payer: 59 | Admitting: Rehabilitative and Restorative Service Providers"

## 2021-09-08 DIAGNOSIS — M79642 Pain in left hand: Secondary | ICD-10-CM

## 2021-09-08 DIAGNOSIS — M25642 Stiffness of left hand, not elsewhere classified: Secondary | ICD-10-CM

## 2021-09-08 DIAGNOSIS — M6281 Muscle weakness (generalized): Secondary | ICD-10-CM

## 2021-09-08 NOTE — Therapy (Signed)
?OUTPATIENT OCCUPATIONAL THERAPY TREATMENT NOTE ? ? ?Patient Name: Pamela Williamson ?MRN: [id number] ?DOB:1976-01-23, 46 y.o., female ?Today's Date: 09/08/2021 ? ?PCP: Cyril Loosen, NP ?REFERRING PROVIDER: Sherilyn Cooter, MD ? ? OT End of Session - 09/08/21 1303   ? ? Visit Number 6   ? Number of Visits 12   ? Date for OT Re-Evaluation 09/18/21   ? OT Start Time 1303   ? OT Stop Time 1354   ? OT Time Calculation (min) 51 min   ? Equipment Utilized During Treatment orthotic materials   ? Activity Tolerance Patient tolerated treatment well;No increased pain   ? Behavior During Therapy [facility] for tasks assessed/performed   ? ?  ?  ? ?  ? ? ? ?Past Medical History:  ?Diagnosis Date  ? Allergy   ? Depression   ? GERD (gastroesophageal reflux disease)   ? IBS (irritable bowel syndrome)   ? ?Past Surgical History:  ?Procedure Laterality Date  ? AUGMENTATION MAMMAPLASTY Bilateral   ? Breast augmentation with breast lift  ? CLOSED REDUCTION FINGER WITH PERCUTANEOUS PINNING Left 07/27/2021  ? Procedure: LEFT SMALL FINGER CLOSED REDUCTION FINGER WITH PERCUTANEOUS PINNING;  Surgeon: Sherilyn Cooter, MD;  Location: [place];  Service: Orthopedics;  Laterality: Left;  ? COLONOSCOPY  03/03/2021  ? [place] at [facility]  ? index finger  Right   ? WISDOM TOOTH EXTRACTION    ? x4 removed  ? ?Patient Active Problem List  ? Diagnosis Date Noted  ? Closed displaced fracture of middle phalanx of left little finger 07/23/2021  ? Nondisplaced fracture of proximal phalanx of left ring finger, initial encounter for closed fracture 07/23/2021  ? IUD (intrauterine device) in place 11/07/2018  ? Abnormal uterine bleeding (AUB) 11/07/2018  ? ? ?ONSET DATE: 07/27/21 DOS ?  ?REFERRING DIAG: T62.263F (ICD-10-CM) - Closed displaced fracture of middle phalanx of left little finger, initial encounter ? ?THERAPY DIAG:  ?Stiffness of left hand, not elsewhere classified ? ?Pain in left hand ? ?Muscle weakness (generalized) ? ? ?PERTINENT  HISTORY: Now 6 weeks post-op. Per MD: "Left small finger P2 fracture s/p CRPP.  Left ring finger P1 fracture being treated nonoperatively.  Therapy to work on edema control and prevent postoperative stiffness. Possible splint for left small finger to immobilize DIPJ." ? ?PRECAUTIONS: NWB in Lt hand for now, wean protection orthotic in day, light hand strength at 7-8 weeks, dynamics ok if MD allows, bracing until 8-10 weeks  ? ?SUBJECTIVE:  ?She notices some ulnar FA "cool, numb" feeling, may be linked to how she is resting arm at work or just tightness in arm/hand. She also noticed SF, RF today are discolored/bruised over PIP J and DIP Js after taking off brace and using hand at work a little bit. She denies any injury, pain, is not very swollen.  ? ? ?PAIN:  ?Are you having pain?  Not now at rest, but she states getting random shooting pains that subside fast (10 seconds)  ?Rating:   0/10 ? ? ?OBJECTIVE: (All objective assessments below are from initial evaluation on: 07/27/21 unless otherwise specified.)  ? ?  ?ADLs: ?Overall ADLs: difficulty and problems opening containers, lifting/carry objects, doing work tasks like typing, delivering babies, etc.  ?  ?  ?FUNCTIONAL OUTCOME MEASURES: ?Quick Dash: 43% impairment today ?  ?UE ROM  Eval: Fingers very straight today at rest/passively (no lags), but motion not indicated for IPJs yet.at [facility]. Detailed measures may be taken next session. Non-involved fingers look  great, minimal swelling, seem fairly flexible.  ?  ?Active ROM Left ?08/06/2021 Left  ?09/04/21  ?Wrist flexion 69 75  ?Wrist extension 69 80  ?(Blank rows = not tested) ?  ?Active ROM Left ?08/06/2021 Left ?08/25/21 Left  ?09/04/21 Left  ?09/08/21  ?Ring MCP (0-90) 90   0- 90   ?Ring PIP (0-100)    0- 64   ?Ring DIP (0-70)    0- 7   ?Little MCP (0-90) 90   0- 90 0-102  ?Little PIP (0-100)    0- 40 0-42  ?Little DIP (0-70)   0-5 0- 22 0-12  ?(Blank rows = not tested) ?  ?  ?UE MMT:   NT at eval ?  ?  ?HAND  FUNCTION: ?Grip strength: Right: 77# lbs; Left: TBD lbs ?  ?COORDINATION: ?Overtly limited at eval, cannot oppose to [facility], cannot make full fist. ? ?09/04/21: Box and Blocks left: 63 WFL (78 is mean)  ?  ?SENSATION: ?Pt c/o tingling/numbness intermittently, this will be further addressed if no resolution when swelling decreases ?  ?EDEMA: minimal in RF and SF today, other fingers not very effected  ? ?09/08/21: 5.7cm swelling base of P1 SF today ?  ?OBSERVATIONS:  ?09/08/21: She has new "cyanotic" -like lok to her SF and RF today directly over PIP J and DIPJ s, but she is non-tender, not painful, not extra swollen, and is blanchable. Capillary refill seems a bit slow. Her hand looks less blue after heat as well.  ?  ?TODAY'S TREATMENT:  ?09/08/21: OT edu for safety to maintain <3# lifting and nothing painful in daily activities. Also edu for self-monitoring discoloration, perfusion, and to go to ED if she became very swollen, painful, poor capillary refill or blanching (all signs of poor vascularity and could be emergent if that occurred).  OT usees IASTM to dorsal/ulnar arm and into hand and fingers to help with MFR and tissue gliding, then continues with manual stretches to fingers in flexion. She is edu to not be so intense and again to relax during stretches as she is guarding somewhat significantly still. She is encouraged for lighter but longer stretches as tolerated. Manual therapy non-painful today.  She also does AROM for measures which look a bit tighter in Ips but looser at MCP, so OT decides to make RMO to block MP flexion and allow more tension into IP Js with active flexion. She is asked to wear in day for 15 min periods as tolerated. She can also put on to do AROM with overpressure 10x and then rest from bracing. Encouraged to continue weaning from resting orthotic but also wear if needed for rest, intermittently and at night.  Lastly, due to c/o of "cool/tingling" ulnarly in FA and sometimes hand, OT edu to  not put pressure on ulnar nerve when resting and to do ulnar nerve glides, which she demo's back well. She is also edu not to bend elbow too much in the night. She states understanding and not having pain at end of session.   ? ? ?09/04/21: While on MH 2-3 mins (no skin irritation), OT reviews HEP for blocking AROM and light PROM at uninvolved joints. She states she's been doing well with no pain. OT then has her complete multiple fnl activities (including box and blocks, in-hand manipulations, etc.) for FMS and light fnl use of hand. She also upgrades to light PROM/stretches at involved joints as tolerated. OT does this manually for her today with education for self-strech.  No pain or problems today, she is edu to wait until Monday if having pain or swelling.  ? ?Exercises ?- Seated Wrist Flexion Stretch  - 3-6 x daily - 3-5 reps - 15 hold ?- Seated Wrist Extension Stretch  - 3-6 x daily - 3-5 reps - 15 hold ?- Tendon Glides  - 3-4 x daily - 5- 10 reps - 2-3 seconds hold ?- Hand AROM DIP Blocking  - 4-6 x daily - 10 reps - 2 seconds hold ?- Hand AROM PIP Blocking  - 4-6 x daily - 1 sets - 10-15 reps ?- Hand AROM Reverse Blocking  - 4-6 x daily - 1 sets - 10-15 reps ?- Seated Single Digit Intrinsic Stretch  - 4-6 x daily - 3-5 reps - 15-20 sec hold ? ?  ?PATIENT EDUCATION: ?Education details: see tx section above for details  ?Person educated: Patient ?Education method: Explanation and Demonstration ?Education comprehension: verbalized understanding, returned demonstration, and needs further education ?  ?  ?HOME EXERCISE PROGRAM: ?Access Code: [email or web address] ?URL: [email or web address] ?Prepared by: Benito Mccreedy ?  ?GOALS: ?Goals reviewed with patient? Yes ?  ?SHORT TERM GOALS: (STG required if POC>30 days) ?  ?Pt will obtain protective, custom orthotic. ?Target date: 08/06/21 ?Goal status: MET ?  ?2.  Pt will demo/state understanding of initial HEP to improve pain levels and prerequisite motion. ?Target  date: 08/21/21 ?Goal status: Met 09/08/21 ?  ?  ?LONG TERM GOALS: ?  ?Pt will improve functional ability by decreased impairment per Quick DASH assessment from 43% to 10% or better, for better quality of life. ?Target d

## 2021-09-11 ENCOUNTER — Ambulatory Visit (INDEPENDENT_AMBULATORY_CARE_PROVIDER_SITE_OTHER): Payer: 59 | Admitting: Rehabilitative and Restorative Service Providers"

## 2021-09-11 ENCOUNTER — Encounter: Payer: Self-pay | Admitting: Rehabilitative and Restorative Service Providers"

## 2021-09-11 DIAGNOSIS — M79642 Pain in left hand: Secondary | ICD-10-CM | POA: Diagnosis not present

## 2021-09-11 DIAGNOSIS — M6281 Muscle weakness (generalized): Secondary | ICD-10-CM

## 2021-09-11 DIAGNOSIS — M25642 Stiffness of left hand, not elsewhere classified: Secondary | ICD-10-CM | POA: Diagnosis not present

## 2021-09-11 NOTE — Therapy (Signed)
?OUTPATIENT OCCUPATIONAL THERAPY TREATMENT NOTE ? ? ?Patient Name: Pamela Williamson ?MRN: [id number] ?DOB:April 06, 1976, 46 y.o., female ?Today's Date: 09/11/2021 ? ?PCP: Cyril Loosen, NP ?REFERRING PROVIDER: Sherilyn Cooter, MD ? ? OT End of Session - 09/11/21 4098   ? ? Visit Number 7   ? Number of Visits 12   ? Date for OT Re-Evaluation 09/18/21   ? OT Start Time [phone]   ? OT Stop Time 0936   ? OT Time Calculation (min) 42 min   ? Activity Tolerance Patient tolerated treatment well;No increased pain   ? Behavior During Therapy [facility] for tasks assessed/performed   ? ?  ?  ? ?  ? ? ? ? ?Past Medical History:  ?Diagnosis Date  ? Allergy   ? Depression   ? GERD (gastroesophageal reflux disease)   ? IBS (irritable bowel syndrome)   ? ?Past Surgical History:  ?Procedure Laterality Date  ? AUGMENTATION MAMMAPLASTY Bilateral   ? Breast augmentation with breast lift  ? CLOSED REDUCTION FINGER WITH PERCUTANEOUS PINNING Left 07/27/2021  ? Procedure: LEFT SMALL FINGER CLOSED REDUCTION FINGER WITH PERCUTANEOUS PINNING;  Surgeon: Sherilyn Cooter, MD;  Location: [place];  Service: Orthopedics;  Laterality: Left;  ? COLONOSCOPY  03/03/2021  ? [place] at [facility]  ? index finger  Right   ? WISDOM TOOTH EXTRACTION    ? x4 removed  ? ?Patient Active Problem List  ? Diagnosis Date Noted  ? Closed displaced fracture of middle phalanx of left little finger 07/23/2021  ? Nondisplaced fracture of proximal phalanx of left ring finger, initial encounter for closed fracture 07/23/2021  ? IUD (intrauterine device) in place 11/07/2018  ? Abnormal uterine bleeding (AUB) 11/07/2018  ? ? ?ONSET DATE: 07/27/21 DOS ?  ?REFERRING DIAG: Y78.295A (ICD-10-CM) - Closed displaced fracture of middle phalanx of left little finger, initial encounter ? ?THERAPY DIAG:  ?Pain in left hand ? ?Muscle weakness (generalized) ? ?Stiffness of left hand, not elsewhere classified ? ? ?PERTINENT HISTORY: Now 6+ weeks post-op. Per MD: "Left small finger  P2 fracture s/p CRPP.  Left ring finger P1 fracture being treated nonoperatively.  Therapy to work on edema control and prevent postoperative stiffness. Possible splint for left small finger to immobilize DIPJ." ? ?PRECAUTIONS: NWB in Lt hand for now, wean protection orthotic in day, light hand strength at 7-8 weeks, dynamics ok if MD allows, bracing until 8-10 weeks  ? ?SUBJECTIVE:  ?She admits to not wearing RMO much, as it was rubbing a little. She did not bring it in for adjustment, but she was advised to wear a bandaide over anywhere rubbing and wear 1-2 hours during the day, at least. She also continues to state tingling, disuse at times and feeling like her finger "is not a part of her body." This is addressed with neuro techniques today.  ? ?PAIN:  ?Are you having pain? Yes  ?Rating:   1-2/10 slight aching, stiffer in mornings.  ? ? ?OBJECTIVE: (All objective assessments below are from initial evaluation on: 07/27/21 unless otherwise specified.)  ? ?  ?ADLs: ?Overall ADLs: difficulty and problems opening containers, lifting/carry objects, doing work tasks like typing, delivering babies, etc.  ?  ?  ?FUNCTIONAL OUTCOME MEASURES: ?Quick Dash: 43% impairment today ?  ?UE ROM  Eval: Fingers very straight today at rest/passively (no lags), but motion not indicated for IPJs yet.at [facility]. Detailed measures may be taken next session. Non-involved fingers look great, minimal swelling, seem fairly flexible.  ?  ?  Active ROM Left ?08/06/2021 Left  ?09/04/21  ?Wrist flexion 69 75  ?Wrist extension 69 80  ?(Blank rows = not tested) ?  ?Active ROM Left ?08/06/2021 Left ?08/25/21 Left  ?09/04/21 Left  ?09/08/21 Left ?09/11/21  ?Ring MCP (0-90) 90   0- 90    ?Ring PIP (0-100)    0- 64  0-61  ?Ring DIP (0-70)    0- 7  0-16  ?Little MCP (0-90) 90   0- 90 0-102   ?Little PIP (0-100)    0- 40 0-42 0-38  ?Little DIP (0-70)   0-5 0- 22 0-12 (-8) - 15  ?(Blank rows = not tested) ?  ?  ?UE MMT:   NT at eval ?  ?  ?HAND FUNCTION: ?Grip strength:  Right: 77# lbs; Left: TBD lbs ?  ?COORDINATION: ?Overtly limited at eval, cannot oppose to [facility], cannot make full fist. ? ?09/04/21: Box and Blocks left: 63 WFL (78 is mean)  ?  ?SENSATION: ?Pt c/o tingling/numbness intermittently, this will be further addressed if no resolution when swelling decreases ?  ?EDEMA: minimal in RF and SF today, other fingers not very effected  ? ?09/08/21: 5.7cm swelling base of P1 SF today ? ?  ?OBSERVATIONS:  ?09/08/21: She has new "cyanotic" -like look to her SF and RF today directly over PIP J and DIPJ s, but she is non-tender, not painful, not extra swollen, and is blanchable. Capillary refill seems a bit slow. Her hand looks less blue after heat as well.  ? ?09/11/21: Bluish color has improved at all knuckles. Seems completely linked to temperature today (observed again after CP)  ?  ?TODAY'S TREATMENT:  ?09/11/21: She does AROM for new measures. Motion is somewhat stubborn but has improved at DIP Js likely due to new RMO. She is re-edu to wear more often and bring next time. She was also re-edu to doe HEP first thing every morning, as she states "being stiff for hours" each morning and not having time to do HEP.  For tingling feeling in ulnar distribution, OT edu on ulnar nerve glides and "waiter carry" nerve glides to include radial nerve as well. Additionally she review there act FMS in-hand manipulations and is encouraged to do joint compression and traction as tol to increase proprioceptive ability. She has mild pain with this today, so encouraged to do lightly.  OT also performs manual therapy stretches to wrist, RF and SF as tolerated in flex, ext at individual joints and in composite. TAM AROM of SF improves from 45* (as measured at start of therapy) to 55* post manual techniques. She is edu to stick with current HEP and RMO until next week, when light strengthening will begin unless new issues or problems present.  ? ?09/08/21: OT edu for safety to maintain <3# lifting and nothing  painful in daily activities. Also edu for self-monitoring discoloration, perfusion, and to go to ED if she became very swollen, painful, poor capillary refill or blanching (all signs of poor vascularity and could be emergent if that occurred).  OT uses IASTM to dorsal/ulnar arm and into hand and fingers to help with MFR and tissue gliding, then continues with manual stretches to fingers in flexion. She is edu to not be so intense and again to relax during stretches as she is guarding somewhat significantly still. She is encouraged for lighter but longer stretches as tolerated. Manual therapy non-painful today.  She also does AROM for measures which look a bit tighter in IPs but  looser at MCP, so OT decides to make RMO to block MP flexion and allow more tension into IP Js with active flexion. She is asked to wear in day for 15 min periods as tolerated. She can also put on to do AROM with overpressure 10x and then rest from bracing. Encouraged to continue weaning from resting orthotic but also wear if needed for rest, intermittently and at night.  Lastly, due to c/o of "cool/tingling" ulnarly in FA and sometimes hand, OT edu to not put pressure on ulnar nerve when resting and to do ulnar nerve glides, which she demo's back well. She is also edu not to bend elbow too much in the night. She states understanding and not having pain at end of session.   ? ?  ?PATIENT EDUCATION: ?Education details: see tx section above for details  ?Person educated: Patient ?Education method: Explanation and Demonstration ?Education comprehension: verbalized understanding, returned demonstration, and needs further education ?  ?  ?HOME EXERCISE PROGRAM: ?Access Code: [email or web address] ?URL: [email or web address] ?Prepared by: Benito Mccreedy ?  ?GOALS: ?Goals reviewed with patient? Yes ?  ?SHORT TERM GOALS: (STG required if POC>30 days) ?  ?Pt will obtain protective, custom orthotic. ?Target date: 08/06/21 ?Goal status: MET ?  ?2.  Pt  will demo/state understanding of initial HEP to improve pain levels and prerequisite motion. ?Target date: 08/21/21 ?Goal status: Met 09/08/21 ?  ?  ?LONG TERM GOALS: ?  ?Pt will improve functional ability b

## 2021-09-14 ENCOUNTER — Ambulatory Visit (INDEPENDENT_AMBULATORY_CARE_PROVIDER_SITE_OTHER): Payer: 59 | Admitting: Rehabilitative and Restorative Service Providers"

## 2021-09-14 ENCOUNTER — Encounter: Payer: Self-pay | Admitting: Rehabilitative and Restorative Service Providers"

## 2021-09-14 DIAGNOSIS — M79642 Pain in left hand: Secondary | ICD-10-CM

## 2021-09-14 DIAGNOSIS — M25642 Stiffness of left hand, not elsewhere classified: Secondary | ICD-10-CM | POA: Diagnosis not present

## 2021-09-14 DIAGNOSIS — M6281 Muscle weakness (generalized): Secondary | ICD-10-CM | POA: Diagnosis not present

## 2021-09-14 NOTE — Therapy (Signed)
?OUTPATIENT OCCUPATIONAL THERAPY TREATMENT NOTE ? ? ?Patient Name: Pamela Williamson ?MRN: [id number] ?DOB:June 18, 1975, 46 y.o., female ?Today's Date: 09/14/2021 ? ?PCP: Cyril Loosen, NP ?REFERRING PROVIDER: Sherilyn Cooter, MD ? ? OT End of Session - 09/14/21 0932   ? ? Visit Number 8   ? Number of Visits 12   ? Date for OT Re-Evaluation 09/18/21   ? OT Start Time 0932   ? OT Stop Time 1015   ? OT Time Calculation (min) 43 min   ? Activity Tolerance Patient tolerated treatment well;No increased pain   ? Behavior During Therapy [facility] for tasks assessed/performed   ? ?  ?  ? ?  ? ? ? ? ? ?Past Medical History:  ?Diagnosis Date  ? Allergy   ? Depression   ? GERD (gastroesophageal reflux disease)   ? IBS (irritable bowel syndrome)   ? ?Past Surgical History:  ?Procedure Laterality Date  ? AUGMENTATION MAMMAPLASTY Bilateral   ? Breast augmentation with breast lift  ? CLOSED REDUCTION FINGER WITH PERCUTANEOUS PINNING Left 07/27/2021  ? Procedure: LEFT SMALL FINGER CLOSED REDUCTION FINGER WITH PERCUTANEOUS PINNING;  Surgeon: Sherilyn Cooter, MD;  Location: [place];  Service: Orthopedics;  Laterality: Left;  ? COLONOSCOPY  03/03/2021  ? [facility] at [facility]  ? index finger  Right   ? WISDOM TOOTH EXTRACTION    ? x4 removed  ? ?Patient Active Problem List  ? Diagnosis Date Noted  ? Closed displaced fracture of middle phalanx of left little finger 07/23/2021  ? Nondisplaced fracture of proximal phalanx of left ring finger, initial encounter for closed fracture 07/23/2021  ? IUD (intrauterine device) in place 11/07/2018  ? Abnormal uterine bleeding (AUB) 11/07/2018  ? ? ?ONSET DATE: 07/27/21 DOS ?  ?REFERRING DIAG: D17.616W (ICD-10-CM) - Closed displaced fracture of middle phalanx of left little finger, initial encounter ? ?THERAPY DIAG:  ?Pain in left hand ? ?Muscle weakness (generalized) ? ?Stiffness of left hand, not elsewhere classified ? ? ?PERTINENT HISTORY: Now 7 weeks post-op. Per MD: "Left small finger  P2 fracture s/p CRPP.  Left ring finger P1 fracture being treated nonoperatively.  Therapy to work on edema control and prevent postoperative stiffness. Possible splint for left small finger to immobilize DIPJ." ? ?PRECAUTIONS: NWB in Lt hand for now, wean protection orthotic in day, light hand strength at 7-8 weeks, dynamics ok if MD allows, bracing until 8-10 weeks  ? ?SUBJECTIVE:  ?She states numbness and function is getting better. HEP going well, RMO is working well, needs slight adjustment. She states doing light work tasks with orthotic off and no pain.  ? ?PAIN:  ?Are you having pain? Yes  ?Rating:   1-2/10 slight aching, stiffer in mornings.  ? ? ?OBJECTIVE: (All objective assessments below are from initial evaluation on: 07/27/21 unless otherwise specified.)  ? ?  ?ADLs: ?Overall ADLs: difficulty and problems opening containers, lifting/carry objects, doing work tasks like typing, delivering babies, etc.  ?  ?  ?FUNCTIONAL OUTCOME MEASURES: ?Quick Dash: 43% impairment today ?  ?UE ROM  Eval: Fingers very straight today at rest/passively (no lags), but motion not indicated for IPJs yet.at [facility]. Detailed measures may be taken next session. Non-involved fingers look great, minimal swelling, seem fairly flexible.  ?  ?Active ROM Left ?08/06/2021 Left  ?09/04/21  ?Wrist flexion 69 75  ?Wrist extension 69 80  ?(Blank rows = not tested) ?  ?Active ROM Left ?08/06/2021 Left ?08/25/21 Left  ?09/04/21 Left  ?09/08/21 Left ?  09/11/21 Left ?09/14/21  ?Ring MCP (0-90) 90   0- 90     ?Ring PIP (0-100)    0- 64  0-61 0-72  ?Ring DIP (0-70)    0- 7  0-16 0-21  ?Little MCP (0-90) 90   0- 90 0-102    ?Little PIP (0-100)    0- 40 0-42 0-38 0-50  ?Little DIP (0-70)   0-5 0- 22 0-12 (-8) - 15 (-10) -21  ?(Blank rows = not tested) ?  ?  ?UE MMT:   NT at eval ?  ?  ?HAND FUNCTION: ?Eval: Grip strength: Right: 77# lbs; Left: TBD lbs ? ?09/14/21: Left: 33 lbs not painful ? ?  ?COORDINATION: ?Overtly limited at eval, cannot oppose to SF, cannot  make full fist. ? ?09/04/21: Box and Blocks left: 63 WFL (78 is mean)  ?  ?SENSATION: ?Pt c/o tingling/numbness intermittently, this will be further addressed if no resolution when swelling decreases ?  ?EDEMA: minimal in RF and SF today, other fingers not very effected  ? ?09/08/21: 5.7cm swelling base of P1 SF today ? ?09/14/21: 5.5cm swelling base of P1 SF today ? ?  ?OBSERVATIONS:  ?09/11/21: Bluish color has improved at all knuckles. Seems completely linked to temperature today (observed again after CP)  ? ?09/08/21: She has new "cyanotic" -like look to her SF and RF today directly over PIP J and DIPJ s, but she is non-tender, not painful, not extra swollen, and is blanchable. Capillary refill seems a bit slow. Her hand looks less blue after heat as well.  ? ?  ?TODAY'S TREATMENT:  ?09/14/21: She does grip and AROM for new measures- tolerates grip with no pain and has better motion now, all joints, all fingers.  ? ?OT does manual stretches to RF and SF in flex at DIP Js, and reviews self-stretches for HEP. Also reviews all of HEP and tailors to just what is needed based on presentation now (see below).  OT also advises no heavy lifting yet, but orthotic is not needed at work now.  ?OT upgrades her HEP to include light hand strengthening dynamic activities with yellow t-putty now in pinches and grips as listed below. She tolerates well, with no pain. These should help encourage IP J flexion. She is edu to stop/reduce frequency if sore or pain develops & use ice modalities. She states understanding, demo's back well.  ? ?Exercises ?- Tendon Glides  - 3-4 x daily - 5- 10 reps - 2-3 seconds hold ?- Seated Single Digit Intrinsic Stretch  - 4-6 x daily - 3-5 reps - 15-20 sec hold ?- Hand AROM DIP Blocking  - 4-6 x daily - 10 reps - 2 seconds hold ?- PUSH KNUCKLES DOWN  - 4-6 x daily - 5 reps - 15 seconds hold ?- Hand AROM Reverse Blocking  - 4-6 x daily - 1 sets - 10-15 reps ?- Full Fist  - 2-3 x daily - 3-5 reps ?- Seated Claw  Fist with Putty  - 2-3 x daily - 5 reps ?- Finger Extension "Pizza!"   - 2-3 x daily - 5 reps ?- Thumb Opposition with Putty  - 2-3 x daily - 5 reps ?- Cutting Putty  - 2-3 x daily - 5 reps ? ? ?09/11/21: She does AROM for new measures. Motion is somewhat stubborn but has improved at DIP Js likely due to new RMO. She is re-edu to wear more often and bring next time. She was also re-edu to doe  HEP first thing every morning, as she states "being stiff for hours" each morning and not having time to do HEP.  For tingling feeling in ulnar distribution, OT edu on ulnar nerve glides and "waiter carry" nerve glides to include radial nerve as well. Additionally she review there act FMS in-hand manipulations and is encouraged to do joint compression and traction as tol to increase proprioceptive ability. She has mild pain with this today, so encouraged to do lightly.  OT also performs manual therapy stretches to wrist, RF and SF as tolerated in flex, ext at individual joints and in composite. TAM AROM of SF improves from 45* (as measured at start of therapy) to 55* post manual techniques. She is edu to stick with current HEP and RMO until next week, when light strengthening will begin unless new issues or problems present.  ? ?  ?PATIENT EDUCATION: ?Education details: see tx section above for details  ?Person educated: Patient ?Education method: Explanation and Demonstration ?Education comprehension: verbalized understanding, returned demonstration, and needs further education ?  ?  ?HOME EXERCISE PROGRAM: ?Access Code: [email or web address] ?URL: [email or web address] ?Prepared by: Benito Mccreedy ?  ?GOALS: ?Goals reviewed with patient? Yes ?  ?SHORT TERM GOALS: (STG required if POC>30 days) ?  ?Pt will obtain protective, custom orthotic. ?Target date: 08/06/21 ?Goal status: MET ?  ?2.  Pt will demo/state understanding of initial HEP to improve pain levels and prerequisite motion. ?Target date: 08/21/21 ?Goal status: Met  09/08/21 ?  ?  ?LONG TERM GOALS: ?  ?Pt will improve functional ability by decreased impairment per Quick DASH assessment from 43% to 10% or better, for better quality of life. ?Target date: 09/25/21 ?Goal stat

## 2021-09-17 ENCOUNTER — Encounter: Payer: Self-pay | Admitting: Rehabilitative and Restorative Service Providers"

## 2021-09-17 ENCOUNTER — Encounter: Payer: 59 | Admitting: Rehabilitative and Restorative Service Providers"

## 2021-09-17 ENCOUNTER — Ambulatory Visit (INDEPENDENT_AMBULATORY_CARE_PROVIDER_SITE_OTHER): Payer: 59 | Admitting: Rehabilitative and Restorative Service Providers"

## 2021-09-17 DIAGNOSIS — M25642 Stiffness of left hand, not elsewhere classified: Secondary | ICD-10-CM | POA: Diagnosis not present

## 2021-09-17 DIAGNOSIS — M79642 Pain in left hand: Secondary | ICD-10-CM | POA: Diagnosis not present

## 2021-09-17 DIAGNOSIS — M6281 Muscle weakness (generalized): Secondary | ICD-10-CM

## 2021-09-17 NOTE — Therapy (Signed)
?OUTPATIENT OCCUPATIONAL THERAPY TREATMENT NOTE ? ? ?Patient Name: Pamela Williamson ?MRN: [id number] ?DOB:Dec 05, 1975, 46 y.o., female ?Today's Date: 09/17/2021 ? ?PCP: Cyril Loosen, NP ?REFERRING PROVIDER: Sherilyn Cooter, MD ? ? OT End of Session - 09/17/21 1104   ? ? Visit Number 9   ? Number of Visits 12   ? Date for OT Re-Evaluation 09/18/21   ? OT Start Time 1104   ? OT Stop Time 1151   ? OT Time Calculation (min) 47 min   ? Activity Tolerance Patient tolerated treatment well;No increased pain;Patient limited by pain   ? Behavior During Therapy [facility] for tasks assessed/performed   ? ?  ?  ? ?  ? ? ?Past Medical History:  ?Diagnosis Date  ? Allergy   ? Depression   ? GERD (gastroesophageal reflux disease)   ? IBS (irritable bowel syndrome)   ? ?Past Surgical History:  ?Procedure Laterality Date  ? AUGMENTATION MAMMAPLASTY Bilateral   ? Breast augmentation with breast lift  ? CLOSED REDUCTION FINGER WITH PERCUTANEOUS PINNING Left 07/27/2021  ? Procedure: LEFT SMALL FINGER CLOSED REDUCTION FINGER WITH PERCUTANEOUS PINNING;  Surgeon: Sherilyn Cooter, MD;  Location: [place];  Service: Orthopedics;  Laterality: Left;  ? COLONOSCOPY  03/03/2021  ? [facility] at [facility]  ? index finger  Right   ? WISDOM TOOTH EXTRACTION    ? x4 removed  ? ?Patient Active Problem List  ? Diagnosis Date Noted  ? Closed displaced fracture of middle phalanx of left little finger 07/23/2021  ? Nondisplaced fracture of proximal phalanx of left ring finger, initial encounter for closed fracture 07/23/2021  ? IUD (intrauterine device) in place 11/07/2018  ? Abnormal uterine bleeding (AUB) 11/07/2018  ? ? ?ONSET DATE: 07/27/21 DOS ?  ?REFERRING DIAG: Y86.578I (ICD-10-CM) - Closed displaced fracture of middle phalanx of left little finger, initial encounter ? ?THERAPY DIAG:  ?Pain in left hand ? ?Muscle weakness (generalized) ? ?Stiffness of left hand, not elsewhere classified ? ? ?PERTINENT HISTORY: Now 7 weeks post-op. Per MD:  "Left small finger P2 fracture s/p CRPP.  Left ring finger P1 fracture being treated nonoperatively.  Therapy to work on edema control and prevent postoperative stiffness. Possible splint for left small finger to immobilize DIPJ." ? ?PRECAUTIONS: NWB in Lt hand for now, wean protection orthotic in day, light hand strength at 7-8 weeks, dynamics ok if MD allows, bracing until 8-10 weeks  ? ?SUBJECTIVE:  ?She states not wearing orthotic at work now, feeling a bit sore, but doing well.  ? ?PAIN:  ?Are you having pain? Yes  ?Rating:   3/10 slight aching more today ? ? ?OBJECTIVE: (All objective assessments below are from initial evaluation on: 07/27/21 unless otherwise specified.)  ? ?  ?ADLs: ?Overall ADLs: difficulty and problems opening containers, lifting/carry objects, doing work tasks like typing, delivering babies, etc.  ?  ?  ?FUNCTIONAL OUTCOME MEASURES: ?Quick Dash: 43% impairment today ?  ?UE ROM  Eval: Fingers very straight today at rest/passively (no lags), but motion not indicated for IPJs yet.at [facility]. Detailed measures may be taken next session. Non-involved fingers look great, minimal swelling, seem fairly flexible.  ?  ?Active ROM Left ?08/06/2021 Left  ?09/04/21  ?Wrist flexion 69 75  ?Wrist extension 69 80  ?(Blank rows = not tested) ?  ?Active ROM Left ?08/06/2021 Left ?08/25/21 Left  ?09/04/21 Left  ?09/08/21 Left ?09/11/21 Left ?09/14/21 Left  ?09/17/21  ?Ring MCP (0-90) 90   0- 90      ?  Ring PIP (0-100)    0- 64  0-61 0-72 0-81  ?Ring DIP (0-70)    0- 7  0-16 0-21 0-26  ?Little MCP (0-90) 90   0- 90 0-102     ?Little PIP (0-100)    0- 40 0-42 0-38 0-50 0-58  ?Little DIP (0-70)   0-5 0- 22 0-12 (-8) - 15 (-10) -21 (9*)- 21*  ?(Blank rows = not tested) ?  ?  ?UE MMT:   NT at eval ?  ?  ?HAND FUNCTION: ?Eval: Grip strength: Right: 77# lbs; Left: TBD lbs ? ?09/14/21: Left: 33 lbs not painful ? ?  ?COORDINATION: ?Overtly limited at eval, cannot oppose to SF, cannot make full fist. ? ?09/04/21: Box and Blocks left: 63  WFL (78 is mean)  ?  ?SENSATION: ?Pt c/o tingling/numbness intermittently, this will be further addressed if no resolution when swelling decreases ?  ?EDEMA: minimal in RF and SF today, other fingers not very effected  ? ?09/08/21: 5.7cm swelling base of P1 SF today ? ?09/14/21: 5.5cm swelling base of P1 SF today ? ?  ?OBSERVATIONS:  ?09/11/21: Bluish color has improved at all knuckles. Seems completely linked to temperature today (observed again after CP)  ? ?09/08/21: She has new "cyanotic" -like look to her SF and RF today directly over PIP J and DIPJ s, but she is non-tender, not painful, not extra swollen, and is blanchable. Capillary refill seems a bit slow. Her hand looks less blue after heat as well.  ? ?  ?TODAY'S TREATMENT:  ?09/17/21: Finger motion continues to improve steadily/slowly, except for SF DIP today. OT decides to make dynamic flexion orthosis (with MCP J blocking piece to target IP J flexion) to increase PROM to RF and SF- it fits well, delivers a "light to medium" stretch with no pain, and she is encouraged to wear 2-3 x day for 10-15 mins as tolerated. OT also reviews full HEP and does a few light-medium manual stretches to her fingers.  OT also reviews new putty exercises and she seems to struggle most with DIP related exercises, but is doing well. Edu to stop any treatment if very painful or increasing pain after 2-3 hours post exercise. She states understanding. Also reviews finger blocking in extension to prevent ext lags from dynamic PROM.  ? ? ?09/14/21: She does grip and AROM for new measures- tolerates grip with no pain and has better motion now, all joints, all fingers.  ? ?OT does manual stretches to RF and SF in flex at DIP Js, and reviews self-stretches for HEP. Also reviews all of HEP and tailors to just what is needed based on presentation now (see below).  OT also advises no heavy lifting yet, but orthotic is not needed at work now.  ?OT upgrades her HEP to include light hand  strengthening dynamic activities with yellow t-putty now in pinches and grips as listed below. She tolerates well, with no pain. These should help encourage IP J flexion. She is edu to stop/reduce frequency if sore or pain develops & use ice modalities. She states understanding, demo's back well.  ? ?Exercises ?- Tendon Glides  - 3-4 x daily - 5- 10 reps - 2-3 seconds hold ?- Seated Single Digit Intrinsic Stretch  - 4-6 x daily - 3-5 reps - 15-20 sec hold ?- Hand AROM DIP Blocking  - 4-6 x daily - 10 reps - 2 seconds hold ?- PUSH KNUCKLES DOWN  - 4-6 x daily - 5 reps -  15 seconds hold ?- Hand AROM Reverse Blocking  - 4-6 x daily - 1 sets - 10-15 reps ?- Full Fist  - 2-3 x daily - 3-5 reps ?- Seated Claw Fist with Putty  - 2-3 x daily - 5 reps ?- Finger Extension "Pizza!"   - 2-3 x daily - 5 reps ?- Thumb Opposition with Putty  - 2-3 x daily - 5 reps ?- Cutting Putty  - 2-3 x daily - 5 reps ? ? ?09/11/21: She does AROM for new measures. Motion is somewhat stubborn but has improved at DIP Js likely due to new RMO. She is re-edu to wear more often and bring next time. She was also re-edu to doe HEP first thing every morning, as she states "being stiff for hours" each morning and not having time to do HEP.  For tingling feeling in ulnar distribution, OT edu on ulnar nerve glides and "waiter carry" nerve glides to include radial nerve as well. Additionally she review there act FMS in-hand manipulations and is encouraged to do joint compression and traction as tol to increase proprioceptive ability. She has mild pain with this today, so encouraged to do lightly.  OT also performs manual therapy stretches to wrist, RF and SF as tolerated in flex, ext at individual joints and in composite. TAM AROM of SF improves from 45* (as measured at start of therapy) to 55* post manual techniques. She is edu to stick with current HEP and RMO until next week, when light strengthening will begin unless new issues or problems present.  ? ?   ?PATIENT EDUCATION: ?Education details: see tx section above for details  ?Person educated: Patient ?Education method: Explanation and Demonstration ?Education comprehension: verbalized understanding, returned dem

## 2021-09-22 ENCOUNTER — Ambulatory Visit (INDEPENDENT_AMBULATORY_CARE_PROVIDER_SITE_OTHER): Payer: 59 | Admitting: Rehabilitative and Restorative Service Providers"

## 2021-09-22 ENCOUNTER — Ambulatory Visit: Payer: Self-pay

## 2021-09-22 ENCOUNTER — Encounter: Payer: Self-pay | Admitting: Rehabilitative and Restorative Service Providers"

## 2021-09-22 ENCOUNTER — Ambulatory Visit (INDEPENDENT_AMBULATORY_CARE_PROVIDER_SITE_OTHER): Payer: 59 | Admitting: Orthopedic Surgery

## 2021-09-22 DIAGNOSIS — M25642 Stiffness of left hand, not elsewhere classified: Secondary | ICD-10-CM | POA: Diagnosis not present

## 2021-09-22 DIAGNOSIS — M79642 Pain in left hand: Secondary | ICD-10-CM

## 2021-09-22 DIAGNOSIS — S62645A Nondisplaced fracture of proximal phalanx of left ring finger, initial encounter for closed fracture: Secondary | ICD-10-CM

## 2021-09-22 DIAGNOSIS — M6281 Muscle weakness (generalized): Secondary | ICD-10-CM | POA: Diagnosis not present

## 2021-09-22 DIAGNOSIS — S62627A Displaced fracture of medial phalanx of left little finger, initial encounter for closed fracture: Secondary | ICD-10-CM

## 2021-09-22 NOTE — Therapy (Signed)
?OUTPATIENT OCCUPATIONAL THERAPY TREATMENT & PROGRESS NOTE ? ? ?Patient Name: Pamela Williamson ?MRN: [id number] ?DOB:May 05, 1976, 46 y.o., female ?Today's Date: 09/22/2021 ? ?PCP: Cyril Loosen, NP ?REFERRING PROVIDER: Sherilyn Cooter, MD ? ?Progress Note ?Reporting Period 08/06/21 to 09/22/21 ? ?See note below for Objective Data and Assessment of Progress/Goals.  ? ?  ? ? OT End of Session - 09/22/21 1507   ? ? Visit Number 10   ? Number of Visits 12   ? Date for OT Re-Evaluation 10/23/21   ? OT Start Time 1510   ? OT Stop Time 1600   ? OT Time Calculation (min) 50 min   ? Activity Tolerance Patient tolerated treatment well;No increased pain;Patient limited by pain   ? Behavior During Therapy [facility] for tasks assessed/performed   ? ?  ?  ? ?  ? ? ? ?Past Medical History:  ?Diagnosis Date  ? Allergy   ? Depression   ? GERD (gastroesophageal reflux disease)   ? IBS (irritable bowel syndrome)   ? ?Past Surgical History:  ?Procedure Laterality Date  ? AUGMENTATION MAMMAPLASTY Bilateral   ? Breast augmentation with breast lift  ? CLOSED REDUCTION FINGER WITH PERCUTANEOUS PINNING Left 07/27/2021  ? Procedure: LEFT SMALL FINGER CLOSED REDUCTION FINGER WITH PERCUTANEOUS PINNING;  Surgeon: Sherilyn Cooter, MD;  Location: [place];  Service: Orthopedics;  Laterality: Left;  ? COLONOSCOPY  03/03/2021  ? [facility] at [facility]  ? index finger  Right   ? WISDOM TOOTH EXTRACTION    ? x4 removed  ? ?Patient Active Problem List  ? Diagnosis Date Noted  ? Closed displaced fracture of middle phalanx of left little finger 07/23/2021  ? Nondisplaced fracture of proximal phalanx of left ring finger, initial encounter for closed fracture 07/23/2021  ? IUD (intrauterine device) in place 11/07/2018  ? Abnormal uterine bleeding (AUB) 11/07/2018  ? ? ?ONSET DATE: 07/27/21 DOS ?  ?REFERRING DIAG: N56.213Y (ICD-10-CM) - Closed displaced fracture of middle phalanx of left little finger, initial encounter ? ?THERAPY DIAG:  ?Pain in  left hand ? ?Muscle weakness (generalized) ? ?Stiffness of left hand, not elsewhere classified ? ? ?PERTINENT HISTORY: Now 8 weeks post-op. Per MD: "Left small finger P2 fracture s/p CRPP.  Left ring finger P1 fracture being treated nonoperatively.  Therapy to work on edema control and prevent postoperative stiffness. Possible splint for left small finger to immobilize DIPJ." ? ? ?PRECAUTIONS: NWB in Lt hand for now, wean protection orthotic in day, light hand strength at 7-8 weeks, dynamics ok if MD allows, bracing until 8-10 weeks  ? ? ?SUBJECTIVE:  ?She states doing well, weaning orthotic, still having night pains at times, still stiff. Orthotic is not painful. Putty has been slowly helping.  She states her husband asked her to help lift something heavy at their home and she could not because it was too heavy and painful. (This will be added as a goal)  ? ? ?PAIN:  ?Are you having pain? Yes   ?Rating:   1/10 slight aching more today ? ? ?OBJECTIVE: (All objective assessments below are from initial evaluation on: 07/27/21 unless otherwise specified.)  ? ?  ?ADLs: ?Overall ADLs: difficulty and problems opening containers, lifting/carry objects, doing work tasks like typing, delivering babies, etc.  ?  ?  ?FUNCTIONAL OUTCOME MEASURES: ?Eval: Quick Dash: 43% impairment today ? ?09/22/21: Katina Dung: 9% impairment today ?  ?UE ROM  Eval: Fingers very straight today at rest/passively (no lags), but motion not  indicated for IPJs yet.at [facility]. Detailed measures may be taken next session. Non-involved fingers look great, minimal swelling, seem fairly flexible.  ?  ?Active ROM Left ?08/06/2021 Left  ?09/04/21  ?Wrist flexion 69 75  ?Wrist extension 69 80  ?(Blank rows = not tested) ?  ?Active ROM Left ?08/06/2021 Left  ?09/17/21 Left ?09/22/21  ?Ring MCP (0-90) 90     ?Ring PIP (0-100)   0-81   ?Ring DIP (0-70)   0-26   ?Little MCP (0-90) 90   0- 104  ?Little PIP (0-100)   0-58 0- 56  ?Little DIP (0-70)   (9*)- 21* (-15*) - 21   ?(Blank rows = not tested) ?  ?  ?  ?HAND FUNCTION: ?Eval: Grip strength: Right: 77# lbs; Left: TBD lbs ? ?09/14/21: Left: 33 lbs not painful ? ?09/22/21: Left: 45.7 # not painful ? ?  ?COORDINATION: ?Overtly limited at eval, cannot oppose to SF, cannot make full fist. ? ?09/04/21: Box and Blocks left: 63 WFL (78 is mean)  ? ?  ?SENSATION: ?Pt c/o tingling/numbness intermittently, this will be further addressed if no resolution when swelling decreases ? ?09/22/21: 17m Static 2PD ulnar nerve in left hand ?  ?EDEMA:  ?09/14/21: 5.5cm swelling base of P1 SF today ? ?  ?OBSERVATIONS:  ?09/22/21: SF DIP is still stiff at DIP J, still painful with moderate PROM, but overall has improved. RF no TTP but still somewhat stiff & weak.  ? ?  ?TODAY'S TREATMENT:  ?09/22/21: She does gripping, AROM and PROM for new measures and to discuss progress and reassessment/goals.  She also discusses any current home issues and is given advice. Apparently she has some paresthesia still in SF, but sensory testing is WNL.  She was advised to use vibrating toothbrush and systematic desensitization to help. Also discussed night wrapping to help with continued night swelling/pain. Also recommended to do HEP 1 last time right before bed and consider icing. She states understanding.  ? ?Due to her continued tightness in distal finger joints (PIP J and DIP J compared to MCP J), OT adjusts dynamic orthotic to add a pivot point near MCP J to target forces to 90* from DIP J. This provides better tension at DIP Js of RF and SF now.  (OT is also considering removing MCP J block to allow for full finger PROM to continue to help composite fist.)   ? ? ?09/17/21: Finger motion continues to improve steadily/slowly, except for SF DIP today. OT decides to make dynamic flexion orthosis (with MCP J blocking piece to target IP J flexion) to increase PROM to RF and SF- it fits well, delivers a "light to medium" stretch with no pain, and she is encouraged to wear 2-3 x  day for 10-15 mins as tolerated. OT also reviews full HEP and does a few light-medium manual stretches to her fingers.  OT also reviews new putty exercises and she seems to struggle most with DIP related exercises, but is doing well. Edu to stop any treatment if very painful or increasing pain after 2-3 hours post exercise. She states understanding. Also reviews finger blocking in extension to prevent ext lags from dynamic PROM.  ? ?  ?PATIENT EDUCATION: ?Education details: see tx section above for details  ?Person educated: Patient ?Education method: Explanation and Demonstration ?Education comprehension: verbalized understanding, returned demonstration, and needs further education ?  ?  ?HOME EXERCISE PROGRAM: ?Access Code: [email or web address]?URL: [email or web address] ?Prepared by: NBenito Mccreedy?  ?GOALS: ?Goals reviewed  with patient? Yes ?  ?SHORT TERM GOALS: (STG required if POC>30 days) ?  ?Pt will obtain protective, custom orthotic. ?Target date: 08/06/21 ?Goal status: MET ?  ?2.  Pt will demo/state understanding of initial HEP to improve pain levels and prerequisite motion. ?Target date: 08/21/21 ?Goal status: Met 09/08/21 ?  ?  ?LONG TERM GOALS: ?  ?Pt will improve functional ability by decreased impairment per Quick DASH assessment from 43% to 10% or better, for better quality of life. ?Target date: 09/25/21 ?Goal status: 09/22/21: GOAL MET 9%  ?  ?2.  Pt will improve grip strength in left hand from to at least 40lbs for functional use at home and in IADLs. ?Target date: 09/25/21 ?Goal status: 09/22/21 GOAL MET ?  ?3.  Pt will improve A/ROM in Lt SF TAM to at least 200*, to have functional motion for tasks like and grasp and hold small objects in hand.  ?Target date: 10/23/21 ?Goal status: 09/22/21: Progressing- up to 166* now  ? ?4. Pt will tolerate push/pull activities up to 50# with no pain through hand/arm to help husband with IADL home activities.  ? Target Date: 10/23/21 ? Status: NEW GOAL 09/22/21 ?  ?   ?ASSESSMENT: ?  ?CLINICAL IMPRESSION: ?09/22/21: Pt has been improving steadily, but continues to have pain at night, some swelling, some weakness and stiffness in RF and SF. We will continue therapy 1xweek to

## 2021-09-22 NOTE — Progress Notes (Signed)
? ?  Post-Op Visit Note ?  ?Patient: Pamela Williamson           ?Date of Birth: November 21, 1975           ?MRN: [id number] ?Visit Date: 09/22/2021 ?PCP: Cyril Loosen, NP ? ? ?Assessment & Plan: ? ?Chief Complaint:  ?Chief Complaint  ?Patient presents with  ? Left Little Finger - Follow-up, Fracture  ? ?Visit Diagnoses:  ?1. Closed displaced fracture of middle phalanx of left little finger, initial encounter   ?2. Nondisplaced fracture of proximal phalanx of left ring finger, initial encounter for closed fracture   ? ? ?Plan: Patient is now 8 weeks out from CRPP of her left small finger middle phalanx fracture.  Repeat x-rays today show continued interval healing of the fracture.  She is making progress with therapy on ROM of her fingers.  She has returned to work.  She has some mild clinical malrotation of the small finger that does not seem to affect her ability to make a fist.  I can see her back in another 4-6 weeks to see how she's progressing with therapy.  ? ?Follow-Up Instructions: No follow-ups on file.  ? ?Orders:  ?Orders Placed This Encounter  ?Procedures  ? XR Finger Little Left  ? ?No orders of the defined types were placed in this encounter. ? ? ?Imaging: ?No results found. ? ?PMFS History: ?Patient Active Problem List  ? Diagnosis Date Noted  ? Closed displaced fracture of middle phalanx of left little finger 07/23/2021  ? Nondisplaced fracture of proximal phalanx of left ring finger, initial encounter for closed fracture 07/23/2021  ? IUD (intrauterine device) in place 11/07/2018  ? Abnormal uterine bleeding (AUB) 11/07/2018  ? ?Past Medical History:  ?Diagnosis Date  ? Allergy   ? Depression   ? GERD (gastroesophageal reflux disease)   ? IBS (irritable bowel syndrome)   ?  ?Family History  ?Problem Relation Age of Onset  ? Hypertension Mother   ? Hyperlipidemia Mother   ? Diabetes Mother   ? Obesity Mother   ? Colon polyps Father   ? Parkinson's disease Father   ? Hypothyroidism Sister   ?  Arthritis/Rheumatoid Sister   ? Hypertension Sister   ? Breast cancer Maternal Grandmother   ? Colon cancer Neg Hx   ? Esophageal cancer Neg Hx   ? Rectal cancer Neg Hx   ? Stomach cancer Neg Hx   ?  ?Past Surgical History:  ?Procedure Laterality Date  ? AUGMENTATION MAMMAPLASTY Bilateral   ? Breast augmentation with breast lift  ? CLOSED REDUCTION FINGER WITH PERCUTANEOUS PINNING Left 07/27/2021  ? Procedure: LEFT SMALL FINGER CLOSED REDUCTION FINGER WITH PERCUTANEOUS PINNING;  Surgeon: Sherilyn Cooter, MD;  Location: [place];  Service: Orthopedics;  Laterality: Left;  ? COLONOSCOPY  03/03/2021  ? [facility] at [facility]  ? index finger  Right   ? WISDOM TOOTH EXTRACTION    ? x4 removed  ? ?Social History  ? ?Occupational History  ? Occupation: midwife  ?Tobacco Use  ? Smoking status: Never  ? Smokeless tobacco: Never  ?Vaping Use  ? Vaping Use: Never used  ?Substance and Sexual Activity  ? Alcohol use: Yes  ?  Comment: rarely  ? Drug use: Never  ? Sexual activity: Yes  ?  Partners: Male  ?  Birth control/protection: I.U.D.  ?  Comment: Husband has absence of Vas Deferens  ? ? ? ?

## 2021-09-29 ENCOUNTER — Encounter: Payer: Self-pay | Admitting: Rehabilitative and Restorative Service Providers"

## 2021-09-29 ENCOUNTER — Ambulatory Visit (INDEPENDENT_AMBULATORY_CARE_PROVIDER_SITE_OTHER): Payer: 59 | Admitting: Rehabilitative and Restorative Service Providers"

## 2021-09-29 DIAGNOSIS — M25642 Stiffness of left hand, not elsewhere classified: Secondary | ICD-10-CM | POA: Diagnosis not present

## 2021-09-29 DIAGNOSIS — M79642 Pain in left hand: Secondary | ICD-10-CM

## 2021-09-29 DIAGNOSIS — M6281 Muscle weakness (generalized): Secondary | ICD-10-CM

## 2021-09-29 NOTE — Therapy (Signed)
OUTPATIENT OCCUPATIONAL THERAPY TREATMENT NOTE   Patient Name: Pamela Williamson MRN: [id number] DOB:Jul 01, 1975, 46 y.o., female Today's Date: 09/29/2021  PCP: Cyril Loosen, NP REFERRING PROVIDER: Sherilyn Cooter, MD       OT End of Session - 09/29/21 0849     Visit Number 11    Number of Visits 16    Date for OT Re-Evaluation 10/23/21    OT Start Time 0849    OT Stop Time 0931    OT Time Calculation (min) 42 min    Equipment Utilized During Treatment buddy loops    Activity Tolerance Patient tolerated treatment well;No increased pain;Patient limited by pain    Behavior During Therapy [facility] for tasks assessed/performed               Past Medical History:  Diagnosis Date   Allergy    Depression    GERD (gastroesophageal reflux disease)    IBS (irritable bowel syndrome)    Past Surgical History:  Procedure Laterality Date   AUGMENTATION MAMMAPLASTY Bilateral    Breast augmentation with breast lift   CLOSED REDUCTION FINGER WITH PERCUTANEOUS PINNING Left 07/27/2021   Procedure: LEFT SMALL FINGER CLOSED REDUCTION FINGER WITH PERCUTANEOUS PINNING;  Surgeon: Sherilyn Cooter, MD;  Location: [place];  Service: Orthopedics;  Laterality: Left;   COLONOSCOPY  03/03/2021   [facility] at [facility]   index finger  Right    WISDOM TOOTH EXTRACTION     x4 removed   Patient Active Problem List   Diagnosis Date Noted   Closed displaced fracture of middle phalanx of left little finger 07/23/2021   Nondisplaced fracture of proximal phalanx of left ring finger, initial encounter for closed fracture 07/23/2021   IUD (intrauterine device) in place 11/07/2018   Abnormal uterine bleeding (AUB) 11/07/2018    ONSET DATE: 07/27/21 DOS   REFERRING DIAG: X09.407W (ICD-10-CM) - Closed displaced fracture of middle phalanx of left little finger, initial encounter  THERAPY DIAG:  Muscle weakness (generalized)  Pain in left hand  Stiffness of left hand, not elsewhere  classified   PERTINENT HISTORY: Now 9 weeks post-op. Per MD: "Left small finger P2 fracture s/p CRPP.  Left ring finger P1 fracture being treated nonoperatively.  Therapy to work on edema control and prevent postoperative stiffness. 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 Having issues with FMS and her dynamic orthotic needs adjustment per report. She's been using hand "non-stop" now for long shifts.    PAIN:  Are you having pain? Yes   Rating:   3/10 slight aching more today   OBJECTIVE: (All objective assessments below are from initial evaluation on: 07/27/21 unless otherwise specified.)     ADLs: Overall ADLs: difficulty and problems opening containers, lifting/carry objects, doing work tasks like typing, delivering babies, etc.      FUNCTIONAL OUTCOME MEASURES: Eval: Quick Dash: 43% impairment today  09/22/21: Katina Dung: 9% impairment today   UE ROM  Eval: Fingers very straight today at rest/passively (no lags), but motion not indicated for IPJs yet.at [facility]. Detailed measures may be taken next session. Non-involved fingers look great, minimal swelling, seem fairly flexible.    Active ROM Left 08/06/2021 Left  09/04/21  Wrist flexion 69 75  Wrist extension 69 80  (Blank rows =  not tested)   Active ROM Left 08/06/2021 Left  09/17/21 Left 09/22/21 Left 09/29/21  Ring MCP (0-90) 90      Ring PIP (0-100)   0-81    Ring DIP (0-70)   0-26    Little MCP (0-90) 90   0- 104   Little PIP (0-100)   0-58 0- 56 0- 61  Little DIP (0-70)   (9*)- 21* (-15*) - 21 (-10*) - 23  (Blank rows = not tested)       HAND FUNCTION: Eval: Grip strength: Right: 77# lbs; Left: TBD lbs  09/14/21: Left: 33 lbs not painful  09/22/21: Left: 45.7 # not painful    COORDINATION: Overtly limited at  eval, cannot oppose to SF, cannot make full fist.  09/04/21: Box and Blocks left: 63 WFL (78 is mean)     SENSATION: Pt c/o tingling/numbness intermittently, this will be further addressed if no resolution when swelling decreases  09/22/21: 40m Static 2PD ulnar nerve in left hand   EDEMA:  09/14/21: 5.5cm swelling base of P1 SF today    OBSERVATIONS:  09/22/21: SF DIP is still stiff at DIP J, still painful with moderate PROM, but overall has improved. RF no TTP but still somewhat stiff & weak.     TODAY'S TREATMENT:  09/29/21: While on MH 3 mins, OT adjusts her dynamic orthotic to have more availability to pull fingers into flexion. Wearing and use is reviewed with her. She then goes over HEP thoroughly for all AROM and PROM (also quickly going over some t-putty, but this may need better review next session). She also states enjoying RMO stretch but not wearing often. She is encouraged to wear as much as possible in the day, if it "is working" well for her (encourage DIP and PIP J flexion). OT also does manual MFR with non-slip material over SF and RF with edu for her to self-perform before stretches. Moderate stretches as slightly painful to her (up to 5/10 pain), but she is encouraged to not "push over that pain level with stretches, use ice if needed. OT also provides buddy straps for MF to help with RF flexion and then from RF to SF. She states feeling the pull with use- this is another option to help her regain motions. She performs all of the below with supervision:   Exercises - Tendon Glides  - 3-4 x daily - 5- 10 reps - 2-3 seconds hold - Seated Single Digit Intrinsic Stretch  - 4-6 x daily - 3-5 reps - 15-20 sec hold - Hand AROM DIP Blocking  - 4-6 x daily - 10 reps - 2 seconds hold - PUSH KNUCKLES DOWN  - 4-6 x daily - 5 reps - 15 seconds hold - Hand AROM Reverse Blocking  - 4-6 x daily - 1 sets - 10-15 reps    PATIENT EDUCATION: Education details: see tx section above for details   Person educated: Patient Education method: Explanation and Demonstration Education comprehension: verbalized understanding, returned demonstration, and needs further education     HOME EXERCISE PROGRAM: Access Code: [email or web address]: [email or web address] Prepared by: NBenito Mccreedy  GOALS: Goals reviewed with patient? Yes   SHORT TERM GOALS: (STG required if POC>30 days)   Pt will obtain protective, custom orthotic. Target date: 08/06/21 Goal status: MET   2.  Pt will demo/state understanding of initial HEP to improve pain levels and prerequisite motion. Target date: 08/21/21 Goal status: Met 09/08/21     LONG TERM GOALS:  Pt will improve functional ability by decreased impairment per Quick DASH assessment from 43% to 10% or better, for better quality of life. Target date: 09/25/21 Goal status: 09/22/21: GOAL MET 9%    2.  Pt will improve grip strength in left hand from to at least 40lbs for functional use at home and in IADLs. Target date: 09/25/21 Goal status: 09/22/21 GOAL MET   3.  Pt will improve A/ROM in Lt SF TAM to at least 200*, to have functional motion for tasks like and grasp and hold small objects in hand.  Target date: 10/23/21 Goal status: 09/22/21: Progressing- up to 166* now   4. Pt will tolerate push/pull activities up to 50# with no pain through hand/arm to help husband with IADL home activities.   Target Date: 10/23/21  Status: NEW GOAL 09/22/21     ASSESSMENT:   CLINICAL IMPRESSION: 09/29/21: HEP looks good, and orthotic working, just should use them more. Upgrade to light wrist training PRE with hand grasp around weight to also encourage finger flexion and strength.   09/22/21: Pt has been improving steadily, but continues to have pain at night, some swelling, some weakness and stiffness in RF and SF. We will continue therapy 1xweek to adjust orthotics as needed to help with this and monitor HEP and POC increasing PRE as tolerated to wrist and  proximal.    PLAN: OT FREQUENCY: 1x/week   OT DURATION: 4 more weeks (through 10/23/21)   PLANNED INTERVENTIONS: self care/ADL training, therapeutic exercise, therapeutic activity, neuromuscular re-education, manual therapy, scar mobilization, passive range of motion, splinting, electrical stimulation, ultrasound, fluidotherapy, compression bandaging, moist heat, cryotherapy, contrast bath, patient/family education, coping strategies training, and DME and/or AE instructions   RECOMMENDED OTHER SERVICES: none now   CONSULTED AND AGREED WITH PLAN OF CARE: Patient   PLAN FOR NEXT SESSION:  Upgrade to light wrist training PRE with hand grasp around weight to also encourage finger flexion and strength. Review putty HEP as needed as well. Trial UBE for endurance activity and to help with swelling as well.    Benito Mccreedy, OTR/L, CHT 09/29/2021, 10:37 AM

## 2021-10-07 ENCOUNTER — Encounter: Payer: 59 | Admitting: Rehabilitative and Restorative Service Providers"

## 2021-10-09 ENCOUNTER — Ambulatory Visit (INDEPENDENT_AMBULATORY_CARE_PROVIDER_SITE_OTHER): Payer: 59 | Admitting: Rehabilitative and Restorative Service Providers"

## 2021-10-09 ENCOUNTER — Encounter: Payer: Self-pay | Admitting: Rehabilitative and Restorative Service Providers"

## 2021-10-09 DIAGNOSIS — M79642 Pain in left hand: Secondary | ICD-10-CM

## 2021-10-09 DIAGNOSIS — M25642 Stiffness of left hand, not elsewhere classified: Secondary | ICD-10-CM | POA: Diagnosis not present

## 2021-10-09 DIAGNOSIS — M6281 Muscle weakness (generalized): Secondary | ICD-10-CM

## 2021-10-09 NOTE — Therapy (Signed)
OUTPATIENT OCCUPATIONAL THERAPY TREATMENT NOTE   Patient Name: Pamela Williamson MRN: [id number] DOB:07-18-1975, 46 y.o., female Today's Date: 10/09/2021  PCP: Cyril Loosen, NP REFERRING PROVIDER: Sherilyn Cooter, MD       OT End of Session - 10/09/21 0933     Visit Number 12    Number of Visits 16    Date for OT Re-Evaluation 10/23/21    OT Start Time 0933    OT Stop Time 1036    OT Time Calculation (min) 63 min    Equipment Utilized During Treatment orthotic materials    Activity Tolerance Patient tolerated treatment well;No increased pain    Behavior During Therapy WFL for tasks assessed/performed                Past Medical History:  Diagnosis Date   Allergy    Depression    GERD (gastroesophageal reflux disease)    IBS (irritable bowel syndrome)    Past Surgical History:  Procedure Laterality Date   AUGMENTATION MAMMAPLASTY Bilateral    Breast augmentation with breast lift   CLOSED REDUCTION FINGER WITH PERCUTANEOUS PINNING Left 07/27/2021   Procedure: LEFT SMALL FINGER CLOSED REDUCTION FINGER WITH PERCUTANEOUS PINNING;  Surgeon: Sherilyn Cooter, MD;  Location: [place];  Service: Orthopedics;  Laterality: Left;   COLONOSCOPY  03/03/2021   [facility]   index finger  Right    WISDOM TOOTH EXTRACTION     x4 removed   Patient Active Problem List   Diagnosis Date Noted   Closed displaced fracture of middle phalanx of left little finger 07/23/2021   Nondisplaced fracture of proximal phalanx of left ring finger, initial encounter for closed fracture 07/23/2021   IUD (intrauterine device) in place 11/07/2018   Abnormal uterine bleeding (AUB) 11/07/2018    ONSET DATE: 07/27/21 DOS   REFERRING DIAG: N56.213Y (ICD-10-CM) - Closed displaced fracture of middle phalanx of left little finger, initial encounter  THERAPY DIAG:  Pain in left hand  Muscle weakness (generalized)  Stiffness of left hand, not elsewhere  classified   PERTINENT HISTORY: Now 10+ weeks post-op. Per MD: "Left small finger P2 fracture s/p CRPP.  Left ring finger P1 fracture being treated nonoperatively.  Therapy to work on edema control and prevent postoperative stiffness. Possible splint for left small finger to immobilize DIPJ."   PRECAUTIONS: Hand and arm strength as tol now,dynamics ok,  bracing until 8-10 weeks    SUBJECTIVE:  She states dynamic orthosis is not applying stretch to fingers now, needs tightened. She states wearing RMO more and it's "working," and she states doing HEP as much as she can.    PAIN:  Are you having pain? Yes Rating:   0-1/10 slight aching more today   OBJECTIVE: (All objective assessments below are from initial evaluation on: 07/27/21 unless otherwise specified.)     ADLs: Overall ADLs: difficulty and problems opening containers, lifting/carry objects, doing work tasks like typing, delivering babies, etc.      FUNCTIONAL OUTCOME MEASURES: Eval: Quick Dash: 43% impairment today  09/22/21: Katina Dung: 9% impairment today   UE ROM  Eval: Fingers very straight today at rest/passively (no lags), but motion not indicated for IPJs yet.at [facility]. Detailed measures may be taken next session. Non-involved fingers look great, minimal swelling, seem fairly flexible.    Active ROM Left 08/06/2021 Left  09/04/21  Wrist flexion 69 75  Wrist extension 69 80  (Blank rows = not tested)   Active ROM Left 08/06/2021  Left  09/17/21 Left 09/22/21 Left 09/29/21 Left 10/08/21  Ring MCP (0-90) 90       Ring PIP (0-100)   0-81     Ring DIP (0-70)   0-26     Little MCP (0-90) 90   0- 104  0-100   Little PIP (0-100)   0-58 0- 56 0- 61 0- 84  Little DIP (0-70)   (9*)- 21* (-15*) - 21 (-10*) - 23 (-9*) -35  (Blank rows = not tested)       HAND FUNCTION: Eval: Grip strength: Right: 77# lbs; Left: TBD lbs  09/14/21: Left: 33 lbs not painful  09/22/21: Left: 45.7 # not painful  10/08/21: 42#     COORDINATION: Overtly limited at eval, cannot oppose to [facility], cannot make full fist.  09/04/21: Box and Blocks left: 63 WFL (78 is mean)     SENSATION: Pt c/o tingling/numbness intermittently, this will be further addressed if no resolution when swelling decreases  09/22/21: 36m Static 2PD ulnar nerve in left hand   EDEMA:  10/08/21: 5cm swelling base of P1 SF today (compared to 4.6cm opposite side)   09/14/21: 5.5cm swelling base of P1 SF today    OBSERVATIONS:  10/08/21: She is improving TAM and can now touch SF and RF to palm, though [facility]. Lag at DIP J still present (PROM is full ext, AROM is missing ~9*)   09/22/21: SF DIP is still stiff at DIP J, still painful with moderate PROM, but overall has improved. RF no TTP but still somewhat stiff & weak.     TODAY'S TREATMENT:  10/08/21: She does AROM for exercise and HEP review as well as new measures.  OT does some manual therapy with moderate stretches to fingers and MFR and joint mobilizations around PIP, DIP Js.  She uses UBE for functional push/pull activity (4 mins, 4.0 resistance @50  RPM) and tolerates well to encourage tendon gliding and strong grasp. She states fatigue in arm (possibly from disuse while healing). She is also educated on and performs wrist flexion and ext weighted exercises (2#)  to encourage flexor and extensor tendon gliding though fingers and wrist as well. She is recommended to perform hand/arm strengthening in these ways now. OT also adjusts dynamic orthosis to allow for more finger PROM, as it continues to improve and then slight adjustments are needed. In addition, OT reviews and has her perform both blocking finger flexion to encourage DIP J to flex more under maximal AROM flexion and also DIP blocking extension exercises to tighten up lateral bands and try to improve ext lag (including "pencil pushup" exercise as well).  OT fabricates new, small DIP ext orthosis to help tighten up lateral bands to  terminal tendon, to be worn at night only. It fits well, she states feeling stretch into extension.    PATIENT EDUCATION: Education details: see tx section above for details  Person educated: Patient Education method: ECustomer service managerEducation comprehension: verbalized understanding, returned demonstration, and needs further education     HOME EXERCISE PROGRAM: Access Code: [email or web address]: [email or web address] Prepared by: NBenito Mccreedy  GOALS: Goals reviewed with patient? Yes   SHORT TERM GOALS: (STG required if POC>30 days)   Pt will obtain protective, custom orthotic. Target date: 08/06/21 Goal status: MET   2.  Pt will demo/state understanding of initial HEP to improve pain levels and prerequisite motion. Target date: 08/21/21 Goal status: Met 09/08/21     LONG TERM GOALS:  Pt will improve functional ability by decreased impairment per Quick DASH assessment from 43% to 10% or better, for better quality of life. Target date: 09/25/21 Goal status: 09/22/21: GOAL MET 9%    2.  Pt will improve grip strength in left hand from to at least 40lbs for functional use at home and in IADLs. Target date: 09/25/21 Goal status: 09/22/21 GOAL MET   3.  Pt will improve A/ROM in Lt SF TAM to at least 200*, to have functional motion for tasks like and grasp and hold small objects in hand.  Target date: 10/23/21 Goal status: 09/22/21: Progressing- up to 166* now   4. Pt will tolerate push/pull activities up to 50# with no pain through hand/arm to help husband with IADL home activities.   Target Date: 10/23/21  Status: NEW GOAL 09/22/21     ASSESSMENT:   CLINICAL IMPRESSION: 10/08/21: Due to ext lag, mildly limited DIP J AROM and still needing orthotic adjustments, also some weakness/fatigue with whole arm strength OT will plan on seeing her ext week per POC, and possibly the following week as well. At that point, though, she will likely meet d/c goals.      PLAN: OT FREQUENCY: 1x/week   OT DURATION: 4 more weeks (through 10/23/21)   PLANNED INTERVENTIONS: self care/ADL training, therapeutic exercise, therapeutic activity, neuromuscular re-education, manual therapy, scar mobilization, passive range of motion, splinting, electrical stimulation, ultrasound, fluidotherapy, compression bandaging, moist heat, cryotherapy, contrast bath, patient/family education, coping strategies training, and DME and/or AE instructions   RECOMMENDED OTHER SERVICES: none now   CONSULTED AND AGREED WITH PLAN OF CARE: Patient   PLAN FOR NEXT SESSION:  Check orthoses as needed, continue working on lage in ext and final PIP J, DIP J motion for tight fist to [facility] and whole arm strength.     Benito Mccreedy, OTR/L, CHT 10/09/2021, 10:50 AM

## 2021-10-13 ENCOUNTER — Other Ambulatory Visit: Payer: Self-pay | Admitting: Advanced Practice Midwife

## 2021-10-13 DIAGNOSIS — Z1231 Encounter for screening mammogram for malignant neoplasm of breast: Secondary | ICD-10-CM

## 2021-10-15 ENCOUNTER — Encounter: Payer: Self-pay | Admitting: Rehabilitative and Restorative Service Providers"

## 2021-10-15 ENCOUNTER — Ambulatory Visit (INDEPENDENT_AMBULATORY_CARE_PROVIDER_SITE_OTHER): Payer: 59 | Admitting: Rehabilitative and Restorative Service Providers"

## 2021-10-15 DIAGNOSIS — M6281 Muscle weakness (generalized): Secondary | ICD-10-CM | POA: Diagnosis not present

## 2021-10-15 DIAGNOSIS — M25642 Stiffness of left hand, not elsewhere classified: Secondary | ICD-10-CM | POA: Diagnosis not present

## 2021-10-15 DIAGNOSIS — M79642 Pain in left hand: Secondary | ICD-10-CM

## 2021-10-15 NOTE — Therapy (Signed)
OUTPATIENT OCCUPATIONAL THERAPY TREATMENT NOTE   Patient Name: Pamela Williamson MRN: [id number] DOB:17-Aug-1975, 46 y.o., female Today's Date: 10/15/2021  PCP: Cyril Loosen, NP REFERRING PROVIDER: Sherilyn Cooter, MD       OT End of Session - 10/15/21 1152     Visit Number 13    Number of Visits 16    Date for OT Re-Evaluation 10/23/21    OT Start Time 1152    OT Stop Time 1230    OT Time Calculation (min) 38 min    Equipment Utilized During Treatment --    Activity Tolerance Patient tolerated treatment well;No increased pain    Behavior During Therapy WFL for tasks assessed/performed             Past Medical History:  Diagnosis Date   Allergy    Depression    GERD (gastroesophageal reflux disease)    IBS (irritable bowel syndrome)    Past Surgical History:  Procedure Laterality Date   AUGMENTATION MAMMAPLASTY Bilateral    Breast augmentation with breast lift   CLOSED REDUCTION FINGER WITH PERCUTANEOUS PINNING Left 07/27/2021   Procedure: LEFT SMALL FINGER CLOSED REDUCTION FINGER WITH PERCUTANEOUS PINNING;  Surgeon: Sherilyn Cooter, MD;  Location: [place];  Service: Orthopedics;  Laterality: Left;   COLONOSCOPY  03/03/2021   [facility]   index finger  Right    WISDOM TOOTH EXTRACTION     x4 removed   Patient Active Problem List   Diagnosis Date Noted   Closed displaced fracture of middle phalanx of left little finger 07/23/2021   Nondisplaced fracture of proximal phalanx of left ring finger, initial encounter for closed fracture 07/23/2021   IUD (intrauterine device) in place 11/07/2018   Abnormal uterine bleeding (AUB) 11/07/2018    ONSET DATE: 07/27/21 DOS   REFERRING DIAG: S50.539J (ICD-10-CM) - Closed displaced fracture of middle phalanx of left little finger, initial encounter  THERAPY DIAG:  Pain in left hand  Muscle weakness (generalized)  Stiffness of left hand, not elsewhere classified   PERTINENT HISTORY: Now  11+ weeks post-op. Per MD: "Left small finger P2 fracture s/p CRPP.  Left ring finger P1 fracture being treated nonoperatively.  Therapy to work on edema control and prevent postoperative stiffness. Possible splint for left small finger to immobilize DIPJ."   PRECAUTIONS: None- advance as tolerated now    SUBJECTIVE:  She states dynamic orthotic fitting better, but she hasn't worn it much. She also states not having a chance to do wrist strength at home for lack of equipment/time, so OT will address in today's session.     PAIN:  Are you having pain? Yes  Rating:   0-1/10 "only very mild"    OBJECTIVE: (All objective assessments below are from initial evaluation on: 07/27/21 unless otherwise specified.)     ADLs: Overall ADLs: difficulty and problems opening containers, lifting/carry objects, doing work tasks like typing, delivering babies, etc.      FUNCTIONAL OUTCOME MEASURES: Eval: Quick Dash: 43% impairment today  09/22/21: Katina Dung: 9% impairment today   UE ROM  Eval: Fingers very straight today at rest/passively (no lags), but motion not indicated for IPJs yet.at [facility]. Detailed measures may be taken next session. Non-involved fingers look great, minimal swelling, seem fairly flexible.    Active ROM Left 08/06/2021 Left  09/04/21  Wrist flexion 69 75  Wrist extension 69 80  (Blank rows = not tested)   Active ROM Left 08/06/2021 Left 09/29/21 Left 10/08/21 Left  10/15/21  Ring MCP (0-90) 90      Ring PIP (0-100)       Ring DIP (0-70)       Little MCP (0-90) 90   0-100    Little PIP (0-100)   0- 61 0- 84 0- 85  Little DIP (0-70)   (-10*) - 23 (-9*) -35 0- 38  (Blank rows = not tested)       HAND FUNCTION: Eval: Grip strength: Right: 77# lbs; Left: TBD lbs  09/14/21: Left: 33 lbs not painful  09/22/21: Left: 45.7 # not painful  10/08/21: Left 42#   10/15/21: Left 68#    COORDINATION: Overtly limited at eval, cannot oppose to SF, cannot make full fist.  09/04/21: Box  and Blocks left: 63 WFL (78 is mean)     SENSATION: Pt c/o tingling/numbness intermittently, this will be further addressed if no resolution when swelling decreases  09/22/21: 28m Static 2PD ulnar nerve in left hand   EDEMA:  10/08/21: 5cm swelling base of P1 SF today (compared to 4.6cm opposite side)   09/14/21: 5.5cm swelling base of P1 SF today    OBSERVATIONS:  10/08/21: She is improving TAM and can now touch SF and RF to palm, though not [facility]. Lag at DIP J still present (PROM is full ext, AROM is missing ~9*)   09/22/21: SF DIP is still stiff at DIP J, still painful with moderate PROM, but overall has improved. RF no TTP but still somewhat stiff & weak.     TODAY'S TREATMENT:  10/15/21: OT reviews most important issues to work on at home: DIP stretches, finger blocking AROM for DIP Js, "claw fist" into putty, and reverse blocking AROM for DIP ext lag a swell. She does these each with OT today, tolerating well and stating understanding.  After that, to further increase tendon pull through fingers (and to improve weakness from disuse while healing), OT has her participate in whole arm strengthening with concurrent strong grasp on weight/handles with flexed wrist if able. This is listed below.   UBE functional push/pull activity (4 mins, 5.5 resistance @45  RPM) Upper body pulls 45# x8  Upper body push 25# x8  Bicep curls 25# with wrist flexed x10 Tricep Rope pushes with tight fist: 35# x10  After these, grip strength is checked and is greatly improved (20# greater). We will continue this style of treatment in next session, especially as she fatigues quickly and states feeling weak.    PATIENT EDUCATION: Education details: see tx section above for details  Person educated: Patient Education method: ECustomer service managerEducation comprehension: verbalized understanding, returned demonstration, and needs further education     HOME EXERCISE PROGRAM: Access Code:  [email or web address]: [email or web address] Prepared by: NBenito Mccreedy  GOALS: Goals reviewed with patient? Yes   SHORT TERM GOALS: (STG required if POC>30 days)   Pt will obtain protective, custom orthotic. Target date: 08/06/21 Goal status: MET   2.  Pt will demo/state understanding of initial HEP to improve pain levels and prerequisite motion. Target date: 08/21/21 Goal status: Met 09/08/21     LONG TERM GOALS:   Pt will improve functional ability by decreased impairment per Quick DASH assessment from 43% to 10% or better, for better quality of life. Target date: 09/25/21 Goal status: 09/22/21: GOAL MET 9%    2.  Pt will improve grip strength in left hand from to at least 40lbs for functional use at home and in IADLs. Target  date: 09/25/21 Goal status: 09/22/21 GOAL MET   3.  Pt will improve A/ROM in Lt SF TAM to at least 200*, to have functional motion for tasks like and grasp and hold small objects in hand.  Target date: 10/23/21 Goal status: 09/22/21: Progressing- up to 166* now   4. Pt will tolerate push/pull activities up to 50# with no pain through hand/arm to help husband with IADL home activities.   Target Date: 10/23/21  Status: NEW GOAL 09/22/21     ASSESSMENT:   CLINICAL IMPRESSION: 10/15/21: She is improving finger motion, though slowly now, grip much improved after whole-arm strength initiated and she does feel fatigued easily, so this may need to continue to be addressed. Ext lag still present mildly. We are focusing on remaining deficits   10/08/21: Due to ext lag, mildly limited DIP J AROM and still needing orthotic adjustments, also some weakness/fatigue with whole arm strength OT will plan on seeing her ext week per POC, and possibly the following week as well. At that point, though, she will likely meet d/c goals.     PLAN: OT FREQUENCY: 1x/week   OT DURATION: 4 more weeks (through 10/23/21)   PLANNED INTERVENTIONS: self care/ADL training, therapeutic  exercise, therapeutic activity, neuromuscular re-education, manual therapy, scar mobilization, passive range of motion, splinting, electrical stimulation, ultrasound, fluidotherapy, compression bandaging, moist heat, cryotherapy, contrast bath, patient/family education, coping strategies training, and DME and/or AE instructions   RECOMMENDED OTHER SERVICES: none now   CONSULTED AND AGREED WITH PLAN OF CARE: Patient   PLAN FOR NEXT SESSION:  Reassess and determine need for continued therapy, focus on DIP J motion (flex and ext lag), grip and arm strength     Katey Barrie, OTR/L, CHT 10/15/2021, 2:42 PM

## 2021-10-21 ENCOUNTER — Ambulatory Visit (INDEPENDENT_AMBULATORY_CARE_PROVIDER_SITE_OTHER): Payer: 59 | Admitting: Rehabilitative and Restorative Service Providers"

## 2021-10-21 ENCOUNTER — Encounter: Payer: Self-pay | Admitting: Rehabilitative and Restorative Service Providers"

## 2021-10-21 DIAGNOSIS — M6281 Muscle weakness (generalized): Secondary | ICD-10-CM

## 2021-10-21 DIAGNOSIS — M25642 Stiffness of left hand, not elsewhere classified: Secondary | ICD-10-CM

## 2021-10-21 DIAGNOSIS — M79642 Pain in left hand: Secondary | ICD-10-CM | POA: Diagnosis not present

## 2021-10-21 NOTE — Therapy (Signed)
OUTPATIENT OCCUPATIONAL THERAPY TREATMENT & PROGRESS NOTE   Patient Name: Pamela Williamson MRN: [id number] DOB:1975/06/20, 46 y.o., female Today's Date: 10/21/2021  PCP: Cyril Loosen, NP REFERRING PROVIDER: Sherilyn Cooter, MD  Progress Note  Reporting Period 08/06/21 to 10/21/21  See note below for Objective Data and Assessment of Progress/Goals.     OT End of Session - 10/21/21 1101     Visit Number 14    Number of Visits 20    Date for OT Re-Evaluation 11/20/21    OT Start Time 1102    OT Stop Time 1151    OT Time Calculation (min) 49 min    Equipment Utilized During Treatment coban strapping    Activity Tolerance Patient tolerated treatment well;No increased pain    Behavior During Therapy WFL for tasks assessed/performed              Past Medical History:  Diagnosis Date   Allergy    Depression    GERD (gastroesophageal reflux disease)    IBS (irritable bowel syndrome)    Past Surgical History:  Procedure Laterality Date   AUGMENTATION MAMMAPLASTY Bilateral    Breast augmentation with breast lift   CLOSED REDUCTION FINGER WITH PERCUTANEOUS PINNING Left 07/27/2021   Procedure: LEFT SMALL FINGER CLOSED REDUCTION FINGER WITH PERCUTANEOUS PINNING;  Surgeon: Sherilyn Cooter, MD;  Location: [place];  Service: Orthopedics;  Laterality: Left;   COLONOSCOPY  03/03/2021   [facility] at [facility]   index finger  Right    WISDOM TOOTH EXTRACTION     x4 removed   Patient Active Problem List   Diagnosis Date Noted   Closed displaced fracture of middle phalanx of left little finger 07/23/2021   Nondisplaced fracture of proximal phalanx of left ring finger, initial encounter for closed fracture 07/23/2021   IUD (intrauterine device) in place 11/07/2018   Abnormal uterine bleeding (AUB) 11/07/2018    ONSET DATE: 07/27/21 DOS   REFERRING DIAG: G99.242A (ICD-10-CM) - Closed displaced fracture of middle phalanx of left little finger, initial  encounter  THERAPY DIAG:  Pain in left hand  Muscle weakness (generalized)  Stiffness of left hand, not elsewhere classified   PERTINENT HISTORY: Now 11+ weeks post-op. Per MD: "Left small finger P2 fracture s/p CRPP.  Left ring finger P1 fracture being treated nonoperatively.  Therapy to work on edema control and prevent postoperative stiffness. Possible splint for left small finger to immobilize DIPJ."   PRECAUTIONS: None- advance as tolerated now    SUBJECTIVE:  She states nothing new, no significant pains except when stretching hard.    PAIN:  Are you having pain? No Rating:   0/10 at rest    OBJECTIVE: (All objective assessments below are from initial evaluation on: 07/27/21 unless otherwise specified.)     ADLs: Overall ADLs: difficulty and problems opening containers, lifting/carry objects, doing work tasks like typing, delivering babies, etc.      FUNCTIONAL OUTCOME MEASURES: Eval: Quick Dash: 43% impairment today  09/22/21: Katina Dung: 9% impairment today   UE ROM  Eval: Fingers very straight today at rest/passively (no lags), but motion not indicated for IPJs yet.at [facility]. Detailed measures may be taken next session. Non-involved fingers look great, minimal swelling, seem fairly flexible.    Active ROM Left 08/06/2021 Left  09/04/21  Wrist flexion 69 75  Wrist extension 69 80  (Blank rows = not tested)   Active ROM Left 08/06/2021 Left 09/29/21 Left 10/08/21 Left 10/15/21 Left  10/21/21  Ring  MCP (0-90) 90     0- 94  Ring PIP (0-100)      0- 96 PROM 49* flexion compared to 61* in right hand)   Ring DIP (0-70)      0- 48  Little MCP (0-90) 90   0-100   0- 100  Little PIP (0-100)   0- 61 0- 84 0- 85 0- 92  Little DIP (0-70)   (-10*) - 23 (-9*) -35 (-9)- 38 (-9) - 42 (compared to 69* right hand)  (Blank rows = not tested)       HAND FUNCTION: Eval: Grip strength: Right: 77# lbs; Left: TBD lbs  09/14/21: Left: 33 lbs not painful  10/15/21: Left 68#  (post  strength session)  10/21/21: Left 62# (measured "cold")  (90# in right hand)    COORDINATION: Overtly limited at eval, cannot oppose to SF, cannot make full fist.  09/04/21: Box and Blocks left: 63 WFL (78 is mean)   10/21/21: she still states some difficulties manipulating needles and suturing at work.     SENSATION: Pt c/o tingling/numbness intermittently, this will be further addressed if no resolution when swelling decreases  09/22/21: 89m Static 2PD ulnar nerve in left hand   EDEMA:  10/21/21: 5.4cm swelling base of P1 SF today  10/08/21: 5cm swelling base of P1 SF today (compared to 4.6cm opposite side)  09/14/21: 5.5cm swelling base of P1 SF today    OBSERVATIONS:  10/21/21: [facility], SF unable with A/PROM still, but much closer.    TODAY'S TREATMENT:  10/21/21: [facility] as well as gripping and HEP exercises (as below) for review, training, and new measures today. HEP is continued to be tailored and OT provides t-band today to help perform upper body strengthening/endurance exercises at home. She states understanding. OT also reviews home/work safety and she has no significant restrictions now, until feeling pain. OT also does manual coban wrapping of SF and RF in intrinsic minus position to help gain DIP J motion, with edu to use 2-3 x day at home (replacing dynamic orthosis which is now not useful as fingers nearly touch palm). OT also reviews t-putty exercises and upgrade gross grasp to green putty now. She states motivated to continue therapy as she needs to make tighter fist to stop from dropping small items, also has FMS difficulties with manipulating needles at work, etc.   Exercises - Tendon Glides  - 3-4 x daily - 5- 10 reps - 2-3 seconds hold - Seated Single Digit Intrinsic Stretch  - 4-6 x daily - 3-5 reps - 15-20 sec hold - Hand AROM DIP Blocking  - 4-6 x daily - 10 reps - 2 seconds hold - PUSH KNUCKLES DOWN  - 4-6 x daily - 5  reps - 15 seconds hold - Hand AROM Reverse Blocking  - 4-6 x daily - 1 sets - 10-15 reps - Full Fist  - 2-3 x daily - 3-5 reps - Seated Claw Fist with Putty  - 2-3 x daily - 5 reps - Finger Extension "Pizza!"   - 2-3 x daily - 5 reps - Standing Bicep Curls with Resistance  - 2-3 x daily - 1-2 sets - 10-15 reps - Standing Elbow Extension with Self-Anchored Resistance  - 2-3 x daily - 1-2 sets - 10-15 reps - Standing Row with Anchored Resistance  - 2-3 x daily - 1-2 sets - 10-15 reps - Chest Press with Resistance  -  2-3 x daily - 1-2 sets - 10-15 reps - Wrist Flexion with Resistance  - 2-3 x daily - 1-2 sets - 10-15 reps - Wrist Extension with Resistance  - 2-3 x daily - 1-2 sets - 10-15 reps   10/15/21: OT reviews most important issues to work on at home: DIP stretches, finger blocking AROM for DIP Js, "claw fist" into putty, and reverse blocking AROM for DIP ext lag a swell. She does these each with OT today, tolerating well and stating understanding.  After that, to further increase tendon pull through fingers (and to improve weakness from disuse while healing), OT has her participate in whole arm strengthening with concurrent strong grasp on weight/handles with flexed wrist if able. This is listed below.   UBE functional push/pull activity (4 mins, 5.5 resistance @45  RPM) Upper body pulls 45# x8  Upper body push 25# x8  Bicep curls 25# with wrist flexed x10 Tricep Rope pushes with tight fist: 35# x10  After these, grip strength is checked and is greatly improved (20# greater). We will continue this style of treatment in next session, especially as she fatigues quickly and states feeling weak.    PATIENT EDUCATION: Education details: see tx section above for details  Person educated: Patient Education method: Customer service manager Education comprehension: verbalized understanding, returned demonstration, and needs further education     HOME EXERCISE PROGRAM: Access Code:  [email or web address] URL: [email or web address] Prepared by: Benito Mccreedy   GOALS: Goals reviewed with patient? Yes   SHORT TERM GOALS: (STG required if POC>30 days)   Pt will obtain protective, custom orthotic. Target date: 08/06/21 Goal status: MET   2.  Pt will demo/state understanding of initial HEP to improve pain levels and prerequisite motion. Target date: 08/21/21 Goal status: Met 09/08/21     LONG TERM GOALS:   Pt will improve functional ability by decreased impairment per Quick DASH assessment from 43% to 10% or better, for better quality of life. Target date: 09/25/21 Goal status: 09/22/21: GOAL MET 9%    2.  Pt will improve grip strength in left hand from to at least 80lbs for functional use at home and in IADLs. Target date: 11/20/21 Goal status: 09/22/21 GOAL MET & upgraded 10/21/21 to 80#   3.  Pt will improve A/ROM in Lt SF TAM to at least 200*, to have functional motion for tasks like and grasp and hold small objects in hand.  Target date: 10/23/21 Goal status: 10/21/21: MET- up to 225* now   4. Pt will tolerate push/pull activities up to 50# with no pain through hand/arm to help husband with IADL home activities.   Target Date: 10/23/21 Status: 11/20/21: Progressing- now pulling up to 45#, but push is still limited.   5. Pt will increase SF flexion to DCP to stop items from falling out of palm (while keeping ext lag less than (-5*) if possible).   Target Date: 11/20/21 Status: 10/21/21: NEW GOAL     ASSESSMENT:   CLINICAL IMPRESSION: 10/21/21: Finger motion, ability, grip coordination are all doing much better, but she still has loss of ring finger and small finger flexion and extension compared to right hand, weakness in left grip compared to right, and decreased functional endurance compared to pre-injury. She states difficulty suturing at work due to continued stiffness and FMS. She will continue upgraded POC to work on remaining fnl endurance issues (from period  of NWB and immobilization), lack of full SF DIP extension, and lack of full tight  AROM RF and SF to [facility] and increasing grip strength.    PLAN: OT FREQUENCY: 1x/week (and 4 additional visits)    OT DURATION: 4 more weeks (through 11/20/21)   PLANNED INTERVENTIONS: self care/ADL training, therapeutic exercise, therapeutic activity, neuromuscular re-education, manual therapy, scar mobilization, passive range of motion, splinting, electrical stimulation, ultrasound, fluidotherapy, compression bandaging, moist heat, cryotherapy, contrast bath, patient/family education, coping strategies training, and DME and/or AE instructions   RECOMMENDED OTHER SERVICES: none now   CONSULTED AND AGREED WITH PLAN OF CARE: Patient   PLAN FOR NEXT SESSION:  She will continue upgraded POC to work on remaining fnl endurance issues (from period of NWB and immobilization), lack of full SF DIP extension, and lack of full tight AROM RF and SF to [facility] and increasing grip strength as well as FMS difficulties with manipulating needles at work.     Benito Mccreedy, OTR/L, CHT 10/21/2021, 12:35 PM

## 2021-10-22 DIAGNOSIS — Z1231 Encounter for screening mammogram for malignant neoplasm of breast: Secondary | ICD-10-CM

## 2021-10-30 ENCOUNTER — Other Ambulatory Visit: Payer: Self-pay | Admitting: Nurse Practitioner

## 2021-10-30 ENCOUNTER — Encounter: Payer: Self-pay | Admitting: Rehabilitative and Restorative Service Providers"

## 2021-10-30 ENCOUNTER — Ambulatory Visit (INDEPENDENT_AMBULATORY_CARE_PROVIDER_SITE_OTHER): Payer: 59 | Admitting: Rehabilitative and Restorative Service Providers"

## 2021-10-30 DIAGNOSIS — M79642 Pain in left hand: Secondary | ICD-10-CM

## 2021-10-30 DIAGNOSIS — M25642 Stiffness of left hand, not elsewhere classified: Secondary | ICD-10-CM | POA: Diagnosis not present

## 2021-10-30 DIAGNOSIS — Z1231 Encounter for screening mammogram for malignant neoplasm of breast: Secondary | ICD-10-CM

## 2021-10-30 DIAGNOSIS — M6281 Muscle weakness (generalized): Secondary | ICD-10-CM | POA: Diagnosis not present

## 2021-11-04 IMAGING — MG DIGITAL SCREENING BREAST BILAT IMPLANT W/ TOMO W/ CAD
9 of 12 series · 9 of 28 positions shown · non-contrast
Comparison: Previous exam(s).

CLINICAL DATA: Screening.

EXAM:
DIGITAL SCREENING BILATERAL MAMMOGRAM WITH IMPLANTS, CAD AND
TOMOSYNTHESIS
TECHNIQUE: Bilateral screening digital craniocaudal and mediolateral oblique
mammograms were obtained. Bilateral screening digital breast
tomosynthesis was performed. The images were evaluated with
computer-aided detection. Standard and/or implant displaced views
were performed.

[R CC]
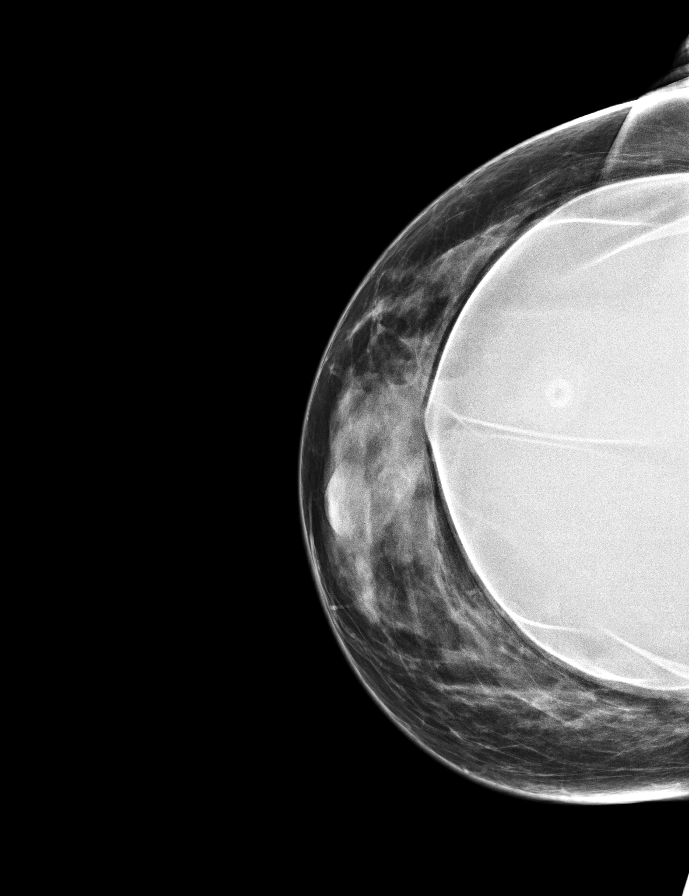

[R MLO]
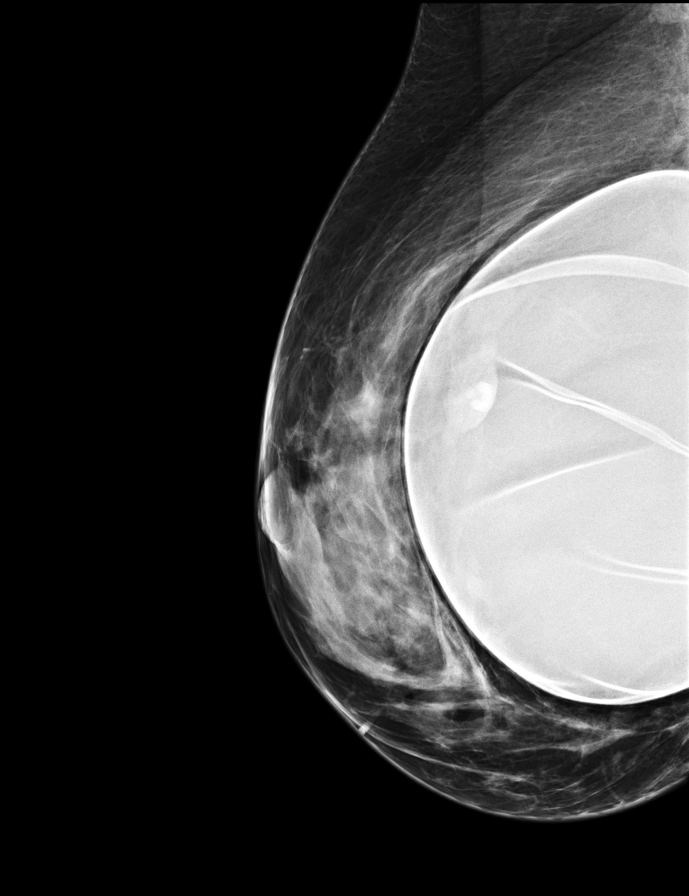

[L MLO]
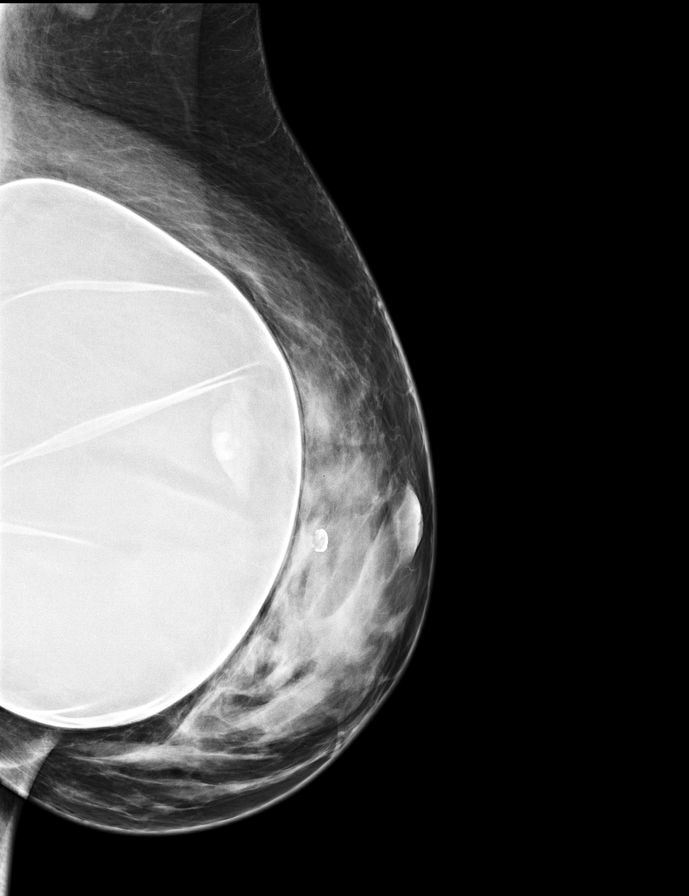

[L CC]
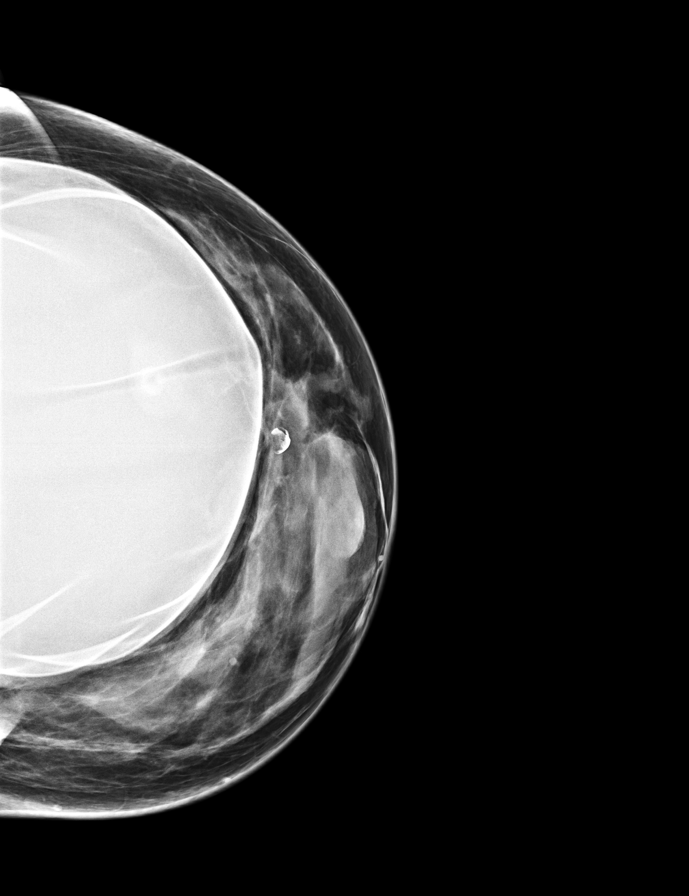

[R CC synth-2D]
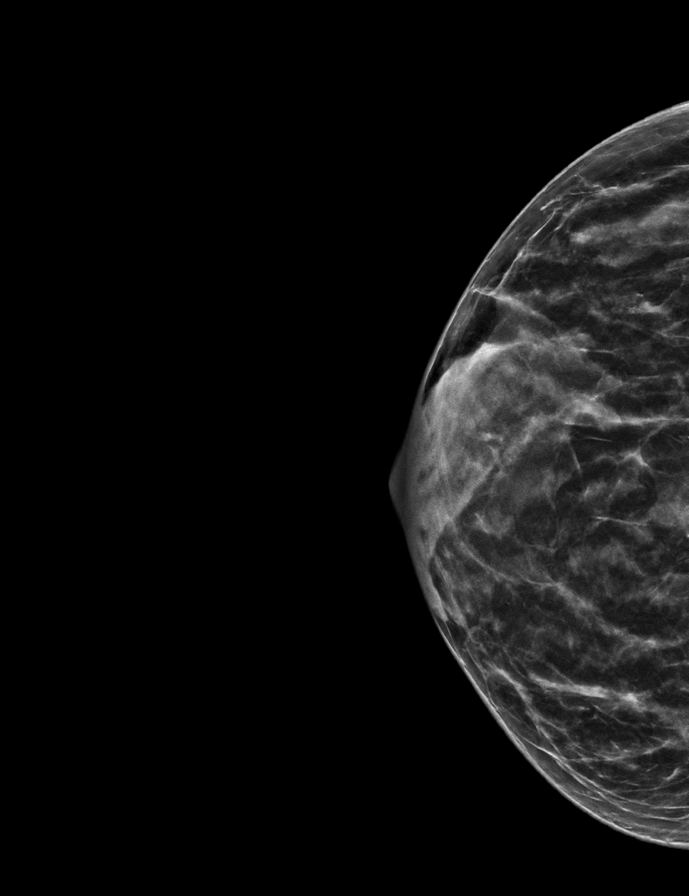

[R MLO synth-2D]
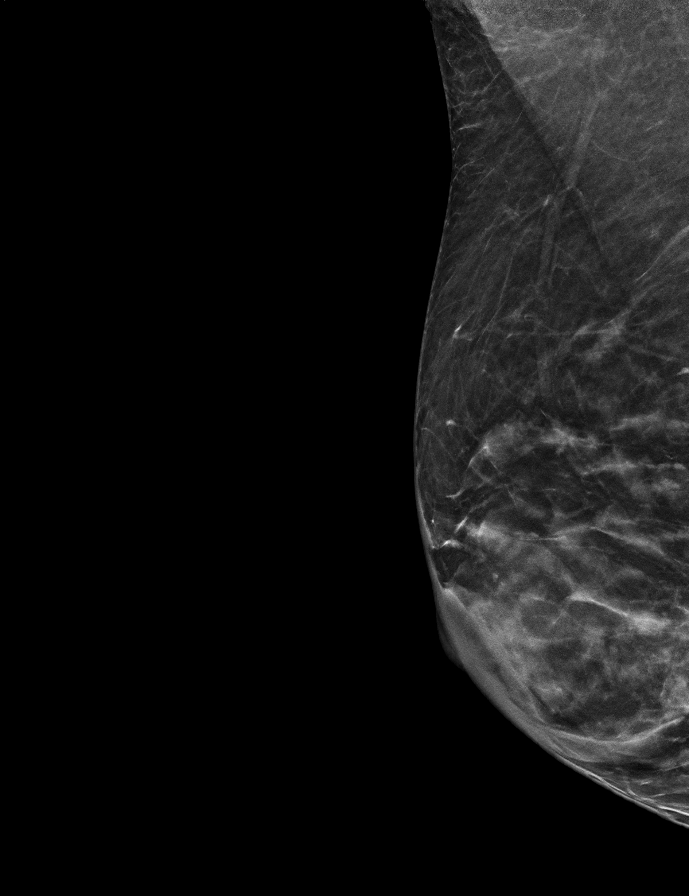

[L CC synth-2D]
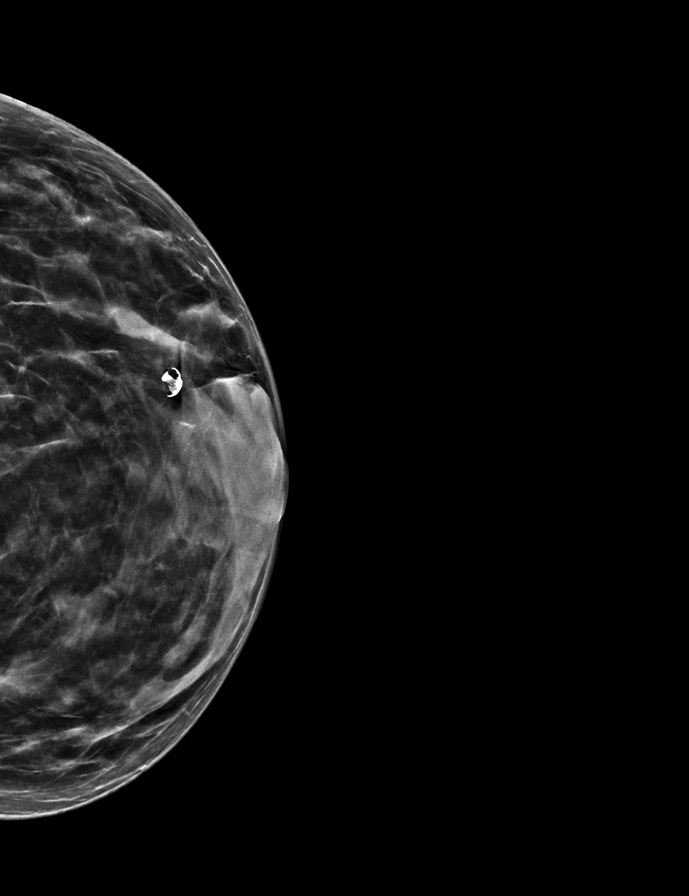

[L MLO synth-2D]
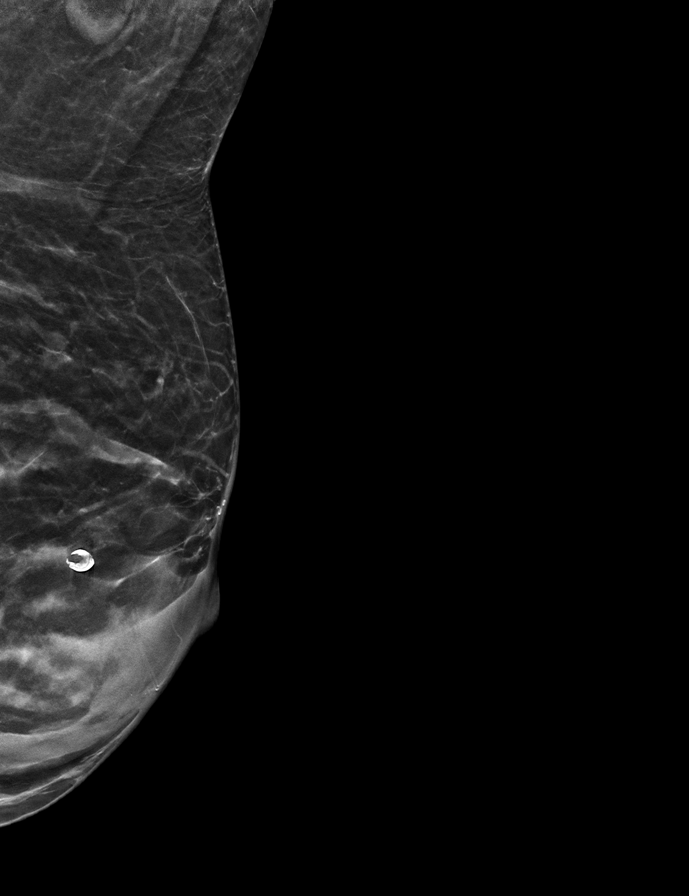

[L MLO tomo · tomo slice 23/45.0]
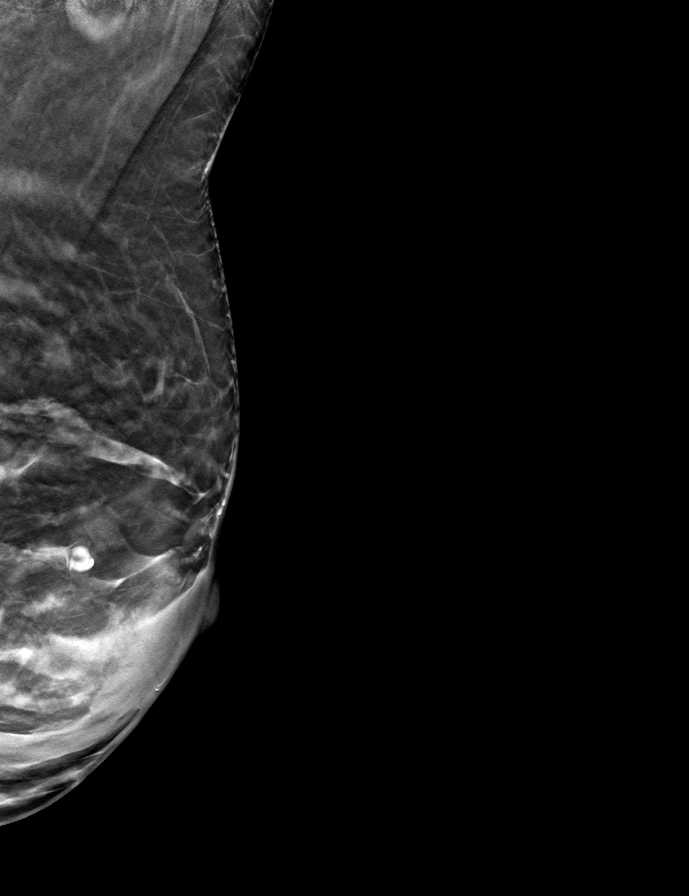

[9 of 28 positions shown; findings below may reference images not displayed]

ACR Breast Density Category c: The breast tissue is heterogeneously
dense, which may obscure small masses.
FINDINGS: The patient has retropectoral implants. There are no findings
suspicious for malignancy.
IMPRESSION: No mammographic evidence of malignancy. A result letter of this
screening mammogram will be mailed directly to the patient.

RECOMMENDATION:
Screening mammogram in one year. (Code:LT-E-7TH)

BI-RADS CATEGORY  1:  Negative.

## 2021-11-12 ENCOUNTER — Ambulatory Visit (INDEPENDENT_AMBULATORY_CARE_PROVIDER_SITE_OTHER): Payer: 59

## 2021-11-12 ENCOUNTER — Encounter: Payer: Self-pay | Admitting: Rehabilitative and Restorative Service Providers"

## 2021-11-12 ENCOUNTER — Ambulatory Visit (INDEPENDENT_AMBULATORY_CARE_PROVIDER_SITE_OTHER): Payer: 59 | Admitting: Rehabilitative and Restorative Service Providers"

## 2021-11-12 DIAGNOSIS — Z1231 Encounter for screening mammogram for malignant neoplasm of breast: Secondary | ICD-10-CM

## 2021-11-12 DIAGNOSIS — M6281 Muscle weakness (generalized): Secondary | ICD-10-CM | POA: Diagnosis not present

## 2021-11-12 DIAGNOSIS — M79642 Pain in left hand: Secondary | ICD-10-CM

## 2021-11-12 DIAGNOSIS — M25642 Stiffness of left hand, not elsewhere classified: Secondary | ICD-10-CM

## 2021-11-12 NOTE — Therapy (Signed)
OUTPATIENT OCCUPATIONAL THERAPY TREATMENT & DISCHARGE NOTE   Patient Name: Pamela Williamson MRN: [id number] DOB:Mar 25, 1976, 46 y.o., female Today's Date: 11/12/2021  PCP: Cyril Loosen, NP REFERRING PROVIDER: Sherilyn Cooter, MD     OT End of Session - 11/12/21 0847     Visit Number 16    Number of Visits 20    Date for OT Re-Evaluation 11/20/21    OT Start Time 0847    OT Stop Time 0931    OT Time Calculation (min) 44 min    Activity Tolerance Patient tolerated treatment well;No increased pain    Behavior During Therapy WFL for tasks assessed/performed             Past Medical History:  Diagnosis Date   Allergy    Depression    GERD (gastroesophageal reflux disease)    IBS (irritable bowel syndrome)    Past Surgical History:  Procedure Laterality Date   AUGMENTATION MAMMAPLASTY Bilateral    Breast augmentation with breast lift   CLOSED REDUCTION FINGER WITH PERCUTANEOUS PINNING Left 07/27/2021   Procedure: LEFT SMALL FINGER CLOSED REDUCTION FINGER WITH PERCUTANEOUS PINNING;  Surgeon: Sherilyn Cooter, MD;  Location: [place];  Service: Orthopedics;  Laterality: Left;   COLONOSCOPY  03/03/2021   [facility] at [facility]   index finger  Right    WISDOM TOOTH EXTRACTION     x4 removed   Patient Active Problem List   Diagnosis Date Noted   Closed displaced fracture of middle phalanx of left little finger 07/23/2021   Nondisplaced fracture of proximal phalanx of left ring finger, initial encounter for closed fracture 07/23/2021   IUD (intrauterine device) in place 11/07/2018   Abnormal uterine bleeding (AUB) 11/07/2018    ONSET DATE: 07/27/21 DOS   REFERRING DIAG: K24.097D (ICD-10-CM) - Closed displaced fracture of middle phalanx of left little finger, initial encounter  THERAPY DIAG:  Pain in left hand  Muscle weakness (generalized)  Stiffness of left hand, not elsewhere classified   PERTINENT HISTORY: Now 12+ weeks post-op. Per MD: "Left  small finger P2 fracture s/p CRPP.  Left ring finger P1 fracture being treated nonoperatively.  Therapy to work on edema control and prevent postoperative stiffness. Possible splint for left small finger to immobilize DIPJ."   SUBJECTIVE:  She states she didn't have time for much stretches while out of town last week. She feels like has plateaued, but no significant issues at home or work now.   PAIN:  Are you having pain? No  Rating:   0/10 at rest    OBJECTIVE: (All objective assessments below are from initial evaluation on: 07/27/21 unless otherwise specified.)     ADLs: Overall ADLs: difficulty and problems opening containers, lifting/carry objects, doing work tasks like typing, delivering babies, etc.      FUNCTIONAL OUTCOME MEASURES: 09/22/21: Katina Dung: 9% impairment today  Eval: Quick Dash: 43% impairment today     UE ROM   11/12/21: She brings all fingers to palm except tip of SF with is ~1cm away from [facility] now. Full opposition to SF   Eval: Fingers very straight today at rest/passively (no lags), but motion not indicated for IPJs yet.at [facility]. Detailed measures may be taken next session. Non-involved fingers look great, minimal swelling, seem fairly flexible.    Active ROM Left 08/06/2021 Left  09/04/21 Left 11/12/21  Wrist flexion 69 75 72  Wrist extension 69 80 70  (Blank rows = not tested)   Active ROM Left 09/29/21 Left  10/08/21 Left  10/21/21 Left  11/11/21  Ring MCP (0-90)   0- 94   Ring PIP (0-100)   0- 96 PROM 49* flexion compared to 61* in right hand)    Ring DIP (0-70)   0- 48 0-53*   Little MCP (0-90)  0-100  0- 100   Little PIP (0-100) 0- 61 0- 84 0- 92   Little DIP (0-70) (-10*) - 23 (-9*) -35 (-9) - 42 (compared to 69* right hand) (-12*) - 44  (71* flex in right SF DIP J)   (Blank rows = not tested)       HAND FUNCTION: Eval: Grip strength: Right: 77# lbs; Left: TBD lbs  10/21/21: Left 62# (measured "cold")  (90# in right hand)  10/30/21: 73# post PRE  today.   11/12/21: 67# measured "cold" today   COORDINATION: Overtly limited at eval, cannot oppose to SF, cannot make full fist.  09/04/21: Box and Blocks left: 63 WFL (78 is mean)   10/21/21: she still states some difficulties manipulating needles and suturing at work.   11/12/21: she states no longer has issues with suturing or other FMS     SENSATION: 11/12/21: no issues now    EDEMA:  11/12/21: none significant now   10/21/21: 5.4cm swelling base of P1 SF today  09/14/21: 5.5cm swelling base of P1 SF today    OBSERVATIONS:  10/21/21: She has full RF to [facility] now with PROM, SF unable with A/PROM still, but much closer.    TODAY'S TREATMENT:  11/12/21: She performs AROM and gripping for new measures, shows best "cold" grip yet, and improving stiffness in SF AROM as well.  Due to no more functional complaints and she is stating feeling max benefit from therapy now, OT discusses last recommendations including finalizing HEP as below, using buddy strap RF to SF at DIP to try to help with malrotation, using dynamics and static progressive braces PRN, etc. She states understanding.   Exercises - Seated Single Digit Intrinsic Stretch  - 4-6 x daily - 3-5 reps - 15-20 sec hold - Hand PROM Finger Extension  - 4-6 x daily - 5 reps - 15 sec hold - Hand AROM Reverse Blocking  - 4-6 x daily - 1 sets - 10-15 reps - Full Fist  - 2-3 x daily - 3-5 reps - Seated Claw Fist with Putty  - 2-3 x daily - 5 reps - Finger Extension "Pizza!"   - 2-3 x daily - 5 reps   PATIENT EDUCATION: Education details: see tx section above for details  Person educated: Patient Education method: Explanation and Demonstration Education comprehension: verbalized understanding, returned demonstration, and needs further education     HOME EXERCISE PROGRAM: Access Code: [email or web address] URL: [email or web address] Prepared by: Benito Mccreedy   GOALS: Goals reviewed with patient? Yes   SHORT TERM GOALS: (STG  required if POC>30 days)   Pt will obtain protective, custom orthotic. Target date: 08/06/21 Goal status: MET   2.  Pt will demo/state understanding of initial HEP to improve pain levels and prerequisite motion. Target date: 08/21/21 Goal status: Met 09/08/21     LONG TERM GOALS:   Pt will improve functional ability by decreased impairment per Quick DASH assessment from 43% to 10% or better, for better quality of life. Target date: 09/25/21 Goal status: 09/22/21: GOAL MET 9%    2.  Pt will improve grip strength in left hand from to at least 80lbs for functional use at home and in IADLs.  Target date: 11/20/21 Goal status: 11/12/21- D/C now, improved but not met.    3.  Pt will improve A/ROM in Lt SF TAM to at least 200*, to have functional motion for tasks like and grasp and hold small objects in hand.  Target date: 10/23/21 Goal status: 10/21/21: MET- up to 225* now   4. Pt will tolerate push/pull activities up to 50# with no pain through hand/arm to help husband with IADL home activities.   Target Date: 11/20/21 Status: 11/12/21- Met   5. Pt will increase SF flexion to DCP to stop items from falling out of palm (while keeping ext lag less than (-5*) if possible).   Target Date: 11/20/21 Status: 11/12/21- D/C now, improved but not met.      ASSESSMENT:   CLINICAL IMPRESSION: 11/12/21: She has maximized benefit from OP OT now, has orthotics to help mobilize and full home exercises to perform as needed. She also has no more functional complaints at home or work. Still present mild malrotation, and slight lag at [facility] DIP ext and ~1cm to [facility] touchdown.    PLAN: OT FREQUENCY: d/c now   OT DURATION: d/c now)   PLANNED INTERVENTIONS: self care/ADL training, therapeutic exercise, therapeutic activity, neuromuscular re-education, manual therapy, scar mobilization, passive range of motion, splinting, electrical stimulation, ultrasound, fluidotherapy, compression bandaging, moist heat, cryotherapy,  contrast bath, patient/family education, coping strategies training, and DME and/or AE instructions   RECOMMENDED OTHER SERVICES: none now   CONSULTED AND AGREED WITH PLAN OF CARE: Patient   PLAN FOR NEXT SESSION:  D/C successfully now, max benefits met   Benito Mccreedy, OTR/L, CHT 11/12/2021, 5:33 PM    OCCUPATIONAL THERAPY DISCHARGE SUMMARY  Visits from Start of Care: 16  Current functional level related to goals / functional outcomes: Pt has met all goals to satisfactory levels and is pleased with outcomes.   Remaining deficits: Pt has no more significant functional deficits or pain.   Education / Equipment: Pt has all needed materials and education. Pt understands how to continue on with self-management. See tx notes for more details.   Patient agrees to discharge due to max benefits received from outpatient occupational therapy / hand therapy at this time.   Benito Mccreedy, OTR/L, CHT 11/12/21

## 2021-12-02 DIAGNOSIS — Z0000 Encounter for general adult medical examination without abnormal findings: Secondary | ICD-10-CM | POA: Diagnosis not present

## 2021-12-02 DIAGNOSIS — E559 Vitamin D deficiency, unspecified: Secondary | ICD-10-CM | POA: Diagnosis not present

## 2022-08-03 ENCOUNTER — Telehealth: Payer: Self-pay | Admitting: Student

## 2022-08-03 DIAGNOSIS — B9689 Other specified bacterial agents as the cause of diseases classified elsewhere: Secondary | ICD-10-CM

## 2022-08-03 DIAGNOSIS — J069 Acute upper respiratory infection, unspecified: Secondary | ICD-10-CM

## 2022-08-03 MED ORDER — AZITHROMYCIN 500 MG PO TABS: 500.0000 mg | ORAL_TABLET | Freq: Every day | ORAL | 0 refills | Status: AC

## 2022-08-03 NOTE — Telephone Encounter (Signed)
Pt called in complaining of 10 day history of productive cough, nasal congestion, and sinus pain. OTC meds used without improvement. Abx sent in. NKDA.   Jorje Guild, NP
